# Patient Record
Sex: Female | Born: 1980 | Race: White | Hispanic: No | Marital: Single | Smoking: Current every day smoker
Health system: Southern US, Community
[De-identification: ages and names within clinical notes are randomized; demographics above are authoritative.]

## PROBLEM LIST (undated history)

## (undated) DIAGNOSIS — F1911 Other psychoactive substance abuse, in remission: Secondary | ICD-10-CM

## (undated) DIAGNOSIS — Z98891 History of uterine scar from previous surgery: Secondary | ICD-10-CM

## (undated) DIAGNOSIS — I10 Essential (primary) hypertension: Secondary | ICD-10-CM

## (undated) DIAGNOSIS — F419 Anxiety disorder, unspecified: Secondary | ICD-10-CM

## (undated) DIAGNOSIS — F988 Other specified behavioral and emotional disorders with onset usually occurring in childhood and adolescence: Secondary | ICD-10-CM

## (undated) HISTORY — PX: NO PAST SURGERIES: SHX2092

## (undated) HISTORY — DX: Other psychoactive substance abuse, in remission: F19.11

## (undated) HISTORY — DX: Other specified behavioral and emotional disorders with onset usually occurring in childhood and adolescence: F98.8

---

## 1999-08-04 ENCOUNTER — Emergency Department (HOSPITAL_COMMUNITY): Admission: EM | Admit: 1999-08-04 | Discharge: 1999-08-04 | Payer: Self-pay | Admitting: Emergency Medicine

## 1999-08-17 ENCOUNTER — Emergency Department (HOSPITAL_COMMUNITY): Admission: EM | Admit: 1999-08-17 | Discharge: 1999-08-17 | Payer: Self-pay | Admitting: Emergency Medicine

## 2002-06-22 ENCOUNTER — Emergency Department (HOSPITAL_COMMUNITY): Admission: EM | Admit: 2002-06-22 | Discharge: 2002-06-23 | Payer: Self-pay | Admitting: Emergency Medicine

## 2003-04-20 ENCOUNTER — Emergency Department (HOSPITAL_COMMUNITY): Admission: EM | Admit: 2003-04-20 | Discharge: 2003-04-20 | Payer: Self-pay | Admitting: Emergency Medicine

## 2003-04-20 ENCOUNTER — Encounter: Payer: Self-pay | Admitting: Emergency Medicine

## 2004-02-19 ENCOUNTER — Emergency Department (HOSPITAL_COMMUNITY): Admission: EM | Admit: 2004-02-19 | Discharge: 2004-02-19 | Payer: Self-pay | Admitting: Emergency Medicine

## 2006-09-01 ENCOUNTER — Emergency Department (HOSPITAL_COMMUNITY): Admission: EM | Admit: 2006-09-01 | Discharge: 2006-09-01 | Payer: Self-pay | Admitting: Emergency Medicine

## 2007-08-05 ENCOUNTER — Emergency Department (HOSPITAL_COMMUNITY): Admission: EM | Admit: 2007-08-05 | Discharge: 2007-08-05 | Payer: Self-pay | Admitting: Emergency Medicine

## 2009-03-14 ENCOUNTER — Emergency Department (HOSPITAL_COMMUNITY): Admission: EM | Admit: 2009-03-14 | Discharge: 2009-03-14 | Payer: Self-pay | Admitting: Radiation Oncology

## 2009-04-07 ENCOUNTER — Emergency Department (HOSPITAL_BASED_OUTPATIENT_CLINIC_OR_DEPARTMENT_OTHER): Admission: EM | Admit: 2009-04-07 | Discharge: 2009-04-07 | Payer: Self-pay | Admitting: Emergency Medicine

## 2009-04-07 ENCOUNTER — Ambulatory Visit: Payer: Self-pay | Admitting: Diagnostic Radiology

## 2009-10-29 ENCOUNTER — Emergency Department (HOSPITAL_BASED_OUTPATIENT_CLINIC_OR_DEPARTMENT_OTHER): Admission: EM | Admit: 2009-10-29 | Discharge: 2009-10-29 | Payer: Self-pay | Admitting: Emergency Medicine

## 2011-01-20 ENCOUNTER — Emergency Department (HOSPITAL_COMMUNITY): Payer: Self-pay

## 2011-01-20 ENCOUNTER — Emergency Department (HOSPITAL_COMMUNITY)
Admission: EM | Admit: 2011-01-20 | Discharge: 2011-01-21 | Disposition: A | Discharge status: Home / Self Care | Payer: Self-pay | Attending: Emergency Medicine | Admitting: Emergency Medicine

## 2011-01-20 DIAGNOSIS — I10 Essential (primary) hypertension: Secondary | ICD-10-CM | POA: Insufficient documentation

## 2011-01-20 DIAGNOSIS — R6884 Jaw pain: Secondary | ICD-10-CM | POA: Insufficient documentation

## 2011-02-11 LAB — ABO/RH

## 2011-08-14 LAB — HEPATITIS B SURFACE ANTIGEN: Hepatitis B Surface Ag: NEGATIVE

## 2011-08-14 LAB — ANTIBODY SCREEN: Antibody Screen: NEGATIVE

## 2011-08-14 LAB — ABO/RH: RH Type: POSITIVE

## 2011-08-14 LAB — GC/CHLAMYDIA PROBE AMP, GENITAL: Chlamydia: NEGATIVE

## 2011-08-14 LAB — RUBELLA ANTIBODY, IGM: Rubella: IMMUNE

## 2011-08-28 LAB — RAPID URINE DRUG SCREEN, HOSP PERFORMED
Amphetamines: NOT DETECTED
Barbiturates: NOT DETECTED
Benzodiazepines: POSITIVE — AB
Cocaine: NOT DETECTED
Opiates: POSITIVE — AB

## 2011-08-28 LAB — CBC
HCT: 37.1
Hemoglobin: 13
WBC: 7.6

## 2011-08-28 LAB — DIFFERENTIAL
Basophils Absolute: 0
Basophils Relative: 1
Eosinophils Absolute: 0.2
Monocytes Relative: 5

## 2011-08-28 LAB — BASIC METABOLIC PANEL
GFR calc non Af Amer: >60
Glucose, Bld: 114 — ABNORMAL HIGH
Potassium: 3.2 — ABNORMAL LOW
Sodium: 140

## 2011-12-25 LAB — STREP B DNA PROBE: GBS: NEGATIVE

## 2012-01-21 ENCOUNTER — Other Ambulatory Visit: Payer: Self-pay | Admitting: Obstetrics and Gynecology

## 2012-01-21 ENCOUNTER — Inpatient Hospital Stay (HOSPITAL_COMMUNITY)
Admission: RE | Admit: 2012-01-21 | Discharge: 2012-01-25 | DRG: 766 | Disposition: A | Discharge status: Home / Self Care | Payer: Medicaid Other | Source: Ambulatory Visit | Attending: Obstetrics and Gynecology | Admitting: Obstetrics and Gynecology

## 2012-01-21 ENCOUNTER — Encounter (HOSPITAL_COMMUNITY): Payer: Self-pay | Admitting: *Deleted

## 2012-01-21 DIAGNOSIS — Z98891 History of uterine scar from previous surgery: Secondary | ICD-10-CM

## 2012-01-21 DIAGNOSIS — O1002 Pre-existing essential hypertension complicating childbirth: Principal | ICD-10-CM | POA: Diagnosis present

## 2012-01-21 HISTORY — DX: History of uterine scar from previous surgery: Z98.891

## 2012-01-21 HISTORY — DX: Essential (primary) hypertension: I10

## 2012-01-21 HISTORY — DX: Anxiety disorder, unspecified: F41.9

## 2012-01-21 LAB — CBC
HCT: 35 % — ABNORMAL LOW (ref 36.0–46.0)
Hemoglobin: 11.8 g/dL — ABNORMAL LOW (ref 12.0–15.0)
MCH: 29.6 pg (ref 26.0–34.0)
MCHC: 33.7 g/dL (ref 30.0–36.0)
MCV: 87.7 fL (ref 78.0–100.0)
RDW: 14.9 % (ref 11.5–15.5)

## 2012-01-21 MED ORDER — OXYCODONE-ACETAMINOPHEN 5-325 MG PO TABS: 1.0000 | ORAL_TABLET | ORAL | Status: DC | PRN

## 2012-01-21 MED ORDER — IBUPROFEN 600 MG PO TABS: 600.0000 mg | ORAL_TABLET | Freq: Four times a day (QID) | ORAL | Status: DC | PRN

## 2012-01-21 MED ORDER — ZOLPIDEM TARTRATE 10 MG PO TABS
10.0000 mg | ORAL_TABLET | Freq: Every evening | ORAL | Status: DC | PRN
  Administered 2012-01-22: 10 mg via ORAL
  Filled 2012-01-21: qty 1

## 2012-01-21 MED ORDER — ACETAMINOPHEN 325 MG PO TABS: 650.0000 mg | ORAL_TABLET | ORAL | Status: DC | PRN

## 2012-01-21 MED ORDER — NICOTINE 21 MG/24HR TD PT24
21.0000 mg | MEDICATED_PATCH | Freq: Every day | TRANSDERMAL | Status: DC
  Filled 2012-01-21 (×5): qty 1

## 2012-01-21 MED ORDER — ONDANSETRON HCL 4 MG/2ML IJ SOLN: 4.0000 mg | Freq: Four times a day (QID) | INTRAMUSCULAR | Status: DC | PRN

## 2012-01-21 MED ORDER — TERBUTALINE SULFATE 1 MG/ML IJ SOLN: 0.2500 mg | Freq: Once | INTRAMUSCULAR | Status: AC | PRN

## 2012-01-21 MED ORDER — LIDOCAINE HCL (PF) 1 % IJ SOLN: 30.0000 mL | INTRAMUSCULAR | Status: DC | PRN

## 2012-01-21 MED ORDER — CITRIC ACID-SODIUM CITRATE 334-500 MG/5ML PO SOLN
30.0000 mL | ORAL | Status: DC | PRN
  Administered 2012-01-23: 30 mL via ORAL
  Filled 2012-01-21: qty 15

## 2012-01-21 MED ORDER — LACTATED RINGERS IV SOLN
INTRAVENOUS | Status: DC
  Administered 2012-01-21 – 2012-01-22 (×4): via INTRAVENOUS

## 2012-01-21 MED ORDER — OXYTOCIN 20 UNITS IN LACTATED RINGERS INFUSION - SIMPLE: 125.0000 mL/h | Freq: Once | INTRAVENOUS | Status: DC

## 2012-01-21 MED ORDER — BUTORPHANOL TARTRATE 2 MG/ML IJ SOLN
1.0000 mg | INTRAMUSCULAR | Status: DC | PRN
  Administered 2012-01-22 (×4): 1 mg via INTRAVENOUS
  Filled 2012-01-21 (×4): qty 1

## 2012-01-21 MED ORDER — METHYLDOPA 250 MG PO TABS
250.0000 mg | ORAL_TABLET | Freq: Two times a day (BID) | ORAL | Status: DC
  Administered 2012-01-21 – 2012-01-25 (×5): 250 mg via ORAL
  Filled 2012-01-21 (×10): qty 1

## 2012-01-21 MED ORDER — OXYTOCIN BOLUS FROM INFUSION
500.0000 mL | Freq: Once | INTRAVENOUS | Status: DC
  Filled 2012-01-21: qty 500

## 2012-01-21 MED ORDER — LACTATED RINGERS IV SOLN
500.0000 mL | INTRAVENOUS | Status: DC | PRN
  Administered 2012-01-22 (×2): 1000 mL via INTRAVENOUS

## 2012-01-21 MED ORDER — MISOPROSTOL 25 MCG QUARTER TABLET
25.0000 ug | ORAL_TABLET | ORAL | Status: DC | PRN
  Administered 2012-01-21 – 2012-01-22 (×2): 25 ug via VAGINAL
  Filled 2012-01-21 (×2): qty 0.25

## 2012-01-21 NOTE — H&P (Signed)
[name] is a 31 y.o. female, G1 P0, EGA 39+ weeks with EDC 3-10 presenting for induction due to [facility].  BP controlled with low dose Aldomet throughout pregnancy, reactive NSTs.  Prenatal care otherwise essentially uncomplicated, see prenatal records for complete history.  Maternal Medical History:  Fetal activity: Perceived fetal activity is normal.    Prenatal complications: Hypertension.     OB History    Grav Para Term Preterm Abortions TAB SAB Ect Mult Living   1              Past Medical History  Diagnosis Date  . Hypertension   . Anxiety    Past Surgical History  Procedure Date  . No past surgeries    Family History: family history is not on file. Social History:  reports that she has been smoking Cigarettes.  She has a 16 pack-year smoking history. She has never used smokeless tobacco. She reports that she drinks alcohol. She reports that she uses illicit drugs (Marijuana) about once per week.  Review of Systems  Respiratory: Negative.   Cardiovascular: Negative.     Dilation: Closed Effacement (%): Thick Station: -2;-3 Exam by:: L.Mears,Rn Blood pressure 146/77, pulse 98, temperature 97.4 F (36.3 C), temperature source Oral, resp. rate 18, height 5\' 8"  (1.727 m), weight 102.967 kg (227 lb). Maternal Exam:  Abdomen: Estimated fetal weight is 8 lbs.   Fetal presentation: vertex  Introitus: Normal vulva. Normal vagina.  Pelvis: adequate for delivery.   Cervix: Cervix evaluated by digital exam.     Physical Exam  Constitutional: She appears well-developed and well-nourished.  Cardiovascular: Normal rate, regular rhythm and normal heart sounds.   Respiratory: Effort normal and breath sounds normal. No respiratory distress.  GI:       gravid    Prenatal labs: ABO, Rh: --/Positive/-- (09/27 0000) Antibody: Negative (09/27 0000) Rubella: Immune (09/27 0000) RPR: Nonreactive (09/27 0000)  HBsAg: Negative (09/27 0000)  HIV: Non-reactive (09/27 0000)  GBS:  Negative (02/07 0000)   Assessment/Plan: IUP at 39 weeks with [facility], well controlled with low dose Aldomet, for ripening and induction.  Will ripen with Cytotec, evaluate in am for further options.  Monitor BP closely.     Berneice Zettlemoyer D 01/21/2012, 9:30 PM

## 2012-01-22 ENCOUNTER — Encounter (HOSPITAL_COMMUNITY): Payer: Self-pay | Admitting: *Deleted

## 2012-01-22 ENCOUNTER — Encounter (HOSPITAL_COMMUNITY): Payer: Self-pay | Admitting: Anesthesiology

## 2012-01-22 LAB — CBC
HCT: 36.4 % (ref 36.0–46.0)
Hemoglobin: 11.9 g/dL — ABNORMAL LOW (ref 12.0–15.0)
WBC: 18 10*3/uL — ABNORMAL HIGH (ref 4.0–10.5)

## 2012-01-22 MED ORDER — FENTANYL 2.5 MCG/ML BUPIVACAINE 1/10 % EPIDURAL INFUSION (WH - ANES)
14.0000 mL/h | INTRAMUSCULAR | Status: DC
  Administered 2012-01-22 – 2012-01-23 (×8): 14 mL/h via EPIDURAL
  Filled 2012-01-22 (×8): qty 60

## 2012-01-22 MED ORDER — BUTORPHANOL TARTRATE 2 MG/ML IJ SOLN
2.0000 mg | Freq: Once | INTRAMUSCULAR | Status: AC
  Administered 2012-01-22: 2 mg via INTRAVENOUS
  Filled 2012-01-22: qty 1

## 2012-01-22 MED ORDER — PHENYLEPHRINE 40 MCG/ML (10ML) SYRINGE FOR IV PUSH (FOR BLOOD PRESSURE SUPPORT): 80.0000 ug | PREFILLED_SYRINGE | INTRAVENOUS | Status: DC | PRN

## 2012-01-22 MED ORDER — PHENYLEPHRINE 40 MCG/ML (10ML) SYRINGE FOR IV PUSH (FOR BLOOD PRESSURE SUPPORT)
80.0000 ug | PREFILLED_SYRINGE | INTRAVENOUS | Status: DC | PRN
  Filled 2012-01-22: qty 5

## 2012-01-22 MED ORDER — DIPHENHYDRAMINE HCL 50 MG/ML IJ SOLN: 12.5000 mg | INTRAMUSCULAR | Status: DC | PRN

## 2012-01-22 MED ORDER — EPHEDRINE 5 MG/ML INJ: 10.0000 mg | INTRAVENOUS | Status: DC | PRN

## 2012-01-22 MED ORDER — OXYTOCIN 20 UNITS IN LACTATED RINGERS INFUSION - SIMPLE
1.0000 m[IU]/min | INTRAVENOUS | Status: DC
  Administered 2012-01-22: 1 m[IU]/min via INTRAVENOUS
  Filled 2012-01-22: qty 1000

## 2012-01-22 MED ORDER — LACTATED RINGERS IV SOLN: 500.0000 mL | Freq: Once | INTRAVENOUS | Status: DC

## 2012-01-22 MED ORDER — EPHEDRINE 5 MG/ML INJ
10.0000 mg | INTRAVENOUS | Status: DC | PRN
  Filled 2012-01-22: qty 4

## 2012-01-22 MED ORDER — LIDOCAINE HCL (PF) 1 % IJ SOLN
INTRAMUSCULAR | Status: DC | PRN
  Administered 2012-01-22 (×2): 5 mL

## 2012-01-22 NOTE — Anesthesia Preprocedure Evaluation (Addendum)
Anesthesia Evaluation  Patient identified by MRN, date of birth, ID band Patient awake    Reviewed: Allergy & Precautions, H&P , NPO status , Patient's Chart, lab work & pertinent test results  Airway Mallampati: II TM Distance: >3 FB Neck ROM: full    Dental No notable dental hx.    Pulmonary neg pulmonary ROS,  breath sounds clear to auscultation  Pulmonary exam normal       Cardiovascular hypertension, negative cardio ROS  Rhythm:regular Rate:Normal     Neuro/Psych Anxiety negative neurological ROS  negative psych ROS   GI/Hepatic negative GI ROS, Neg liver ROS,   Endo/Other  negative endocrine ROS  Renal/GU negative Renal ROS     Musculoskeletal   Abdominal Normal abdominal exam  (+)   Peds  Hematology negative hematology ROS (+)   Anesthesia Other Findings Patient states epidural has not worked well on Right side. Left leg is quite numb and difficult to move.   Reproductive/Obstetrics (+) Pregnancy                         Anesthesia Physical Anesthesia Plan  ASA: III and Emergent  Anesthesia Plan: Epidural   Post-op Pain Management:    Induction:   Airway Management Planned:   Additional Equipment:   Intra-op Plan:   Post-operative Plan:   Informed Consent: I have reviewed the patients History and Physical, chart, labs and discussed the procedure including the risks, benefits and alternatives for the proposed anesthesia with the patient or authorized representative who has indicated his/her understanding and acceptance.     Plan Discussed with: Anesthesiologist, CRNA and Surgeon  Anesthesia Plan Comments:        Anesthesia Quick Evaluation

## 2012-01-22 NOTE — Progress Notes (Signed)
Comfortable with epidural Afeb, VSS, BP 140/80 FHT- Cat II, occasional variable, ctx q 2-3 minutes VE- 3-4/70/-2, vtx, IUPC inserted Continue pitocin, monitor progress, in latent labor

## 2012-01-22 NOTE — Anesthesia Procedure Notes (Addendum)
Epidural Patient location during procedure: OB Start time: 01/22/2012 2:30 PM  Staffing Anesthesiologist: Brayton Caves R Performed by: anesthesiologist   Preanesthetic Checklist Completed: patient identified, site marked, surgical consent, pre-op evaluation, timeout performed, IV checked, risks and benefits discussed and monitors and equipment checked  Epidural Patient position: sitting Prep: site prepped and draped and DuraPrep Patient monitoring: continuous pulse ox and blood pressure Approach: midline Injection technique: LOR air and LOR saline  Needle:  Needle type: Tuohy  Needle gauge: 17 G Needle length: 9 cm Needle insertion depth: 6 cm Catheter type: closed end flexible Catheter size: 19 Gauge Catheter at skin depth: 11 cm Test dose: negative  Assessment Events: blood not aspirated, injection not painful, no injection resistance, negative IV test and no paresthesia  Additional Notes Patient identified.  Risk benefits discussed including failed block, incomplete pain control, headache, nerve damage, paralysis, blood pressure changes, nausea, vomiting, reactions to medication both toxic or allergic, and postpartum back pain.  Patient expressed understanding and wished to proceed.  All questions were answered.  Sterile technique used throughout procedure and epidural site dressed with sterile barrier dressing. No paresthesia or other complications noted.The patient did not experience any signs of intravascular injection such as tinnitus or metallic taste in mouth nor signs of intrathecal spread such as rapid motor block. Please see nursing notes for vital signs.   Spinal  Patient location during procedure: OR Start time: 01/23/2012 7:05 AM Staffing Anesthesiologist: Carlen Rebuck A. Performed by: anesthesiologist  Preanesthetic Checklist Completed: patient identified, site marked, surgical consent, pre-op evaluation, timeout performed, IV checked, risks and benefits  discussed and monitors and equipment checked Spinal Block Patient position: sitting Prep: site prepped and draped and DuraPrep Patient monitoring: heart rate, cardiac monitor, continuous pulse ox and blood pressure Approach: midline Location: L4-5 Injection technique: single-shot Needle Needle type: Sprotte  Needle gauge: 24 G Needle length: 9 cm Needle insertion depth: 6 cm Assessment Sensory level: T4 Additional Notes Patient tolerated procedure well. Adequate sensory level.

## 2012-01-22 NOTE — Progress Notes (Signed)
Feeling some ctx, foley bulb came out, had some bleeding Afeb, VSS, BP 130-150/80-90 FHT- Cat I, irreg ctx VE- 3/70/-2, vtx, AROM clear, minimal bleeding Continue pitocin, monitor progress and BP

## 2012-01-22 NOTE — Progress Notes (Signed)
Feeling some ctx, received 2 doses of Cytotec Afeb, VSS, BP 130-140/80 FHT- Cat I, 130-140, irreg ctx VE- 1/40/-3, vtx, 60cc foley bulb inserted with speculum assistance Will try foley bulb, add low dose pitocin, monitor progress, BP ok for now

## 2012-01-23 ENCOUNTER — Encounter (HOSPITAL_COMMUNITY): Payer: Self-pay | Admitting: *Deleted

## 2012-01-23 ENCOUNTER — Encounter (HOSPITAL_COMMUNITY): Payer: Self-pay | Admitting: Anesthesiology

## 2012-01-23 ENCOUNTER — Inpatient Hospital Stay (HOSPITAL_COMMUNITY): Payer: Medicaid Other | Admitting: Anesthesiology

## 2012-01-23 ENCOUNTER — Encounter (HOSPITAL_COMMUNITY)
Admission: RE | Disposition: A | Discharge status: Home / Self Care | Payer: Self-pay | Source: Ambulatory Visit | Attending: Obstetrics and Gynecology

## 2012-01-23 SURGERY — Surgical Case
Anesthesia: Spinal | Site: Abdomen | Wound class: Clean Contaminated

## 2012-01-23 MED ORDER — KETOROLAC TROMETHAMINE 30 MG/ML IJ SOLN: 30.0000 mg | Freq: Four times a day (QID) | INTRAMUSCULAR | Status: AC | PRN

## 2012-01-23 MED ORDER — DIPHENHYDRAMINE HCL 50 MG/ML IJ SOLN: 12.5000 mg | INTRAMUSCULAR | Status: DC | PRN

## 2012-01-23 MED ORDER — DIPHENHYDRAMINE HCL 25 MG PO CAPS: 25.0000 mg | ORAL_CAPSULE | ORAL | Status: DC | PRN

## 2012-01-23 MED ORDER — FENTANYL CITRATE 0.05 MG/ML IJ SOLN
INTRAMUSCULAR | Status: DC | PRN
  Administered 2012-01-23: 25 ug via INTRATHECAL

## 2012-01-23 MED ORDER — FENTANYL CITRATE 0.05 MG/ML IJ SOLN
INTRAMUSCULAR | Status: DC | PRN
  Administered 2012-01-23 (×3): 25 ug via INTRAVENOUS

## 2012-01-23 MED ORDER — METOCLOPRAMIDE HCL 5 MG/ML IJ SOLN: 10.0000 mg | Freq: Three times a day (TID) | INTRAMUSCULAR | Status: DC | PRN

## 2012-01-23 MED ORDER — ONDANSETRON HCL 4 MG/2ML IJ SOLN
INTRAMUSCULAR | Status: DC | PRN
  Administered 2012-01-23: 4 mg via INTRAVENOUS

## 2012-01-23 MED ORDER — PRENATAL MULTIVITAMIN CH
1.0000 | ORAL_TABLET | Freq: Every day | ORAL | Status: DC
  Administered 2012-01-24 – 2012-01-25 (×2): 1 via ORAL
  Filled 2012-01-23 (×2): qty 1

## 2012-01-23 MED ORDER — NALBUPHINE HCL 10 MG/ML IJ SOLN
5.0000 mg | INTRAMUSCULAR | Status: DC | PRN
  Filled 2012-01-23: qty 1

## 2012-01-23 MED ORDER — DIPHENHYDRAMINE HCL 50 MG/ML IJ SOLN: 25.0000 mg | INTRAMUSCULAR | Status: DC | PRN

## 2012-01-23 MED ORDER — MEPERIDINE HCL 25 MG/ML IJ SOLN: 6.2500 mg | INTRAMUSCULAR | Status: DC | PRN

## 2012-01-23 MED ORDER — SIMETHICONE 80 MG PO CHEW: 80.0000 mg | CHEWABLE_TABLET | ORAL | Status: DC | PRN

## 2012-01-23 MED ORDER — SIMETHICONE 80 MG PO CHEW
80.0000 mg | CHEWABLE_TABLET | Freq: Three times a day (TID) | ORAL | Status: DC
  Administered 2012-01-23 – 2012-01-25 (×8): 80 mg via ORAL

## 2012-01-23 MED ORDER — IBUPROFEN 600 MG PO TABS
600.0000 mg | ORAL_TABLET | Freq: Four times a day (QID) | ORAL | Status: DC
  Administered 2012-01-23 – 2012-01-25 (×8): 600 mg via ORAL
  Filled 2012-01-23 (×8): qty 1

## 2012-01-23 MED ORDER — FENTANYL CITRATE 0.05 MG/ML IJ SOLN: 25.0000 ug | INTRAMUSCULAR | Status: DC | PRN

## 2012-01-23 MED ORDER — FENTANYL CITRATE 0.05 MG/ML IJ SOLN
INTRAMUSCULAR | Status: AC
  Filled 2012-01-23: qty 2

## 2012-01-23 MED ORDER — SODIUM CHLORIDE 0.9 % IV SOLN
1.0000 ug/kg/h | INTRAVENOUS | Status: DC | PRN
  Filled 2012-01-23: qty 2.5

## 2012-01-23 MED ORDER — PHENYLEPHRINE HCL 10 MG/ML IJ SOLN
INTRAMUSCULAR | Status: DC | PRN
  Administered 2012-01-23 (×2): 80 ug via INTRAVENOUS

## 2012-01-23 MED ORDER — OXYTOCIN 10 UNIT/ML IJ SOLN
INTRAMUSCULAR | Status: AC
  Filled 2012-01-23: qty 4

## 2012-01-23 MED ORDER — TETANUS-DIPHTH-ACELL PERTUSSIS 5-2.5-18.5 LF-MCG/0.5 IM SUSP: 0.5000 mL | Freq: Once | INTRAMUSCULAR | Status: DC

## 2012-01-23 MED ORDER — IBUPROFEN 600 MG PO TABS: 600.0000 mg | ORAL_TABLET | Freq: Four times a day (QID) | ORAL | Status: DC | PRN

## 2012-01-23 MED ORDER — LIDOCAINE-EPINEPHRINE (PF) 2 %-1:200000 IJ SOLN
INTRAMUSCULAR | Status: AC
  Filled 2012-01-23: qty 20

## 2012-01-23 MED ORDER — ONDANSETRON HCL 4 MG/2ML IJ SOLN: 4.0000 mg | INTRAMUSCULAR | Status: DC | PRN

## 2012-01-23 MED ORDER — CEFAZOLIN SODIUM 1-5 GM-% IV SOLN
INTRAVENOUS | Status: DC | PRN
  Administered 2012-01-23: 2 g via INTRAVENOUS

## 2012-01-23 MED ORDER — SODIUM BICARBONATE 8.4 % IV SOLN
INTRAVENOUS | Status: AC
  Filled 2012-01-23: qty 50

## 2012-01-23 MED ORDER — ONDANSETRON HCL 4 MG/2ML IJ SOLN
INTRAMUSCULAR | Status: AC
  Filled 2012-01-23: qty 2

## 2012-01-23 MED ORDER — SENNOSIDES-DOCUSATE SODIUM 8.6-50 MG PO TABS
2.0000 | ORAL_TABLET | Freq: Every day | ORAL | Status: DC
  Administered 2012-01-23 – 2012-01-24 (×2): 2 via ORAL

## 2012-01-23 MED ORDER — OXYTOCIN 10 UNIT/ML IJ SOLN
INTRAMUSCULAR | Status: DC | PRN
  Administered 2012-01-23: 20 [IU]

## 2012-01-23 MED ORDER — ACETAMINOPHEN 10 MG/ML IV SOLN
1000.0000 mg | Freq: Four times a day (QID) | INTRAVENOUS | Status: AC | PRN
  Filled 2012-01-23: qty 100

## 2012-01-23 MED ORDER — CEFAZOLIN SODIUM-DEXTROSE 2-3 GM-% IV SOLR
2.0000 g | INTRAVENOUS | Status: DC
  Filled 2012-01-23: qty 50

## 2012-01-23 MED ORDER — LANOLIN HYDROUS EX OINT: 1.0000 "application " | TOPICAL_OINTMENT | CUTANEOUS | Status: DC | PRN

## 2012-01-23 MED ORDER — WITCH HAZEL-GLYCERIN EX PADS: 1.0000 "application " | MEDICATED_PAD | CUTANEOUS | Status: DC | PRN

## 2012-01-23 MED ORDER — KETOROLAC TROMETHAMINE 30 MG/ML IJ SOLN
INTRAMUSCULAR | Status: AC
  Administered 2012-01-23: 30 mg via INTRAVENOUS
  Filled 2012-01-23: qty 1

## 2012-01-23 MED ORDER — ONDANSETRON HCL 4 MG PO TABS: 4.0000 mg | ORAL_TABLET | ORAL | Status: DC | PRN

## 2012-01-23 MED ORDER — KETOROLAC TROMETHAMINE 30 MG/ML IJ SOLN
30.0000 mg | Freq: Four times a day (QID) | INTRAMUSCULAR | Status: AC | PRN
  Administered 2012-01-23: 30 mg via INTRAVENOUS

## 2012-01-23 MED ORDER — SODIUM CHLORIDE 0.9 % IJ SOLN: 3.0000 mL | INTRAMUSCULAR | Status: DC | PRN

## 2012-01-23 MED ORDER — ACETAMINOPHEN 325 MG PO TABS: 325.0000 mg | ORAL_TABLET | ORAL | Status: DC | PRN

## 2012-01-23 MED ORDER — OXYTOCIN 20 UNITS IN LACTATED RINGERS INFUSION - SIMPLE
125.0000 mL/h | INTRAVENOUS | Status: AC
  Administered 2012-01-23: 125 mL/h via INTRAVENOUS
  Filled 2012-01-23: qty 1000

## 2012-01-23 MED ORDER — MAGNESIUM HYDROXIDE 400 MG/5ML PO SUSP: 30.0000 mL | ORAL | Status: DC | PRN

## 2012-01-23 MED ORDER — BUPIVACAINE IN DEXTROSE 0.75-8.25 % IT SOLN
INTRATHECAL | Status: DC | PRN
  Administered 2012-01-23: 1.6 mL via INTRATHECAL

## 2012-01-23 MED ORDER — NALOXONE HCL 0.4 MG/ML IJ SOLN: 0.4000 mg | INTRAMUSCULAR | Status: DC | PRN

## 2012-01-23 MED ORDER — MENTHOL 3 MG MT LOZG: 1.0000 | LOZENGE | OROMUCOSAL | Status: DC | PRN

## 2012-01-23 MED ORDER — MORPHINE SULFATE (PF) 0.5 MG/ML IJ SOLN
INTRAMUSCULAR | Status: DC | PRN
  Administered 2012-01-23: .15 mg via EPIDURAL

## 2012-01-23 MED ORDER — OXYCODONE-ACETAMINOPHEN 5-325 MG PO TABS
1.0000 | ORAL_TABLET | ORAL | Status: DC | PRN
  Administered 2012-01-23: 1 via ORAL
  Administered 2012-01-23: 2 via ORAL
  Administered 2012-01-23: 1 via ORAL
  Administered 2012-01-24 (×6): 2 via ORAL
  Filled 2012-01-23: qty 1
  Filled 2012-01-23 (×4): qty 2
  Filled 2012-01-23 (×2): qty 1
  Filled 2012-01-23 (×4): qty 2
  Filled 2012-01-23: qty 1

## 2012-01-23 MED ORDER — DIPHENHYDRAMINE HCL 25 MG PO CAPS: 25.0000 mg | ORAL_CAPSULE | Freq: Four times a day (QID) | ORAL | Status: DC | PRN

## 2012-01-23 MED ORDER — MORPHINE SULFATE 0.5 MG/ML IJ SOLN
INTRAMUSCULAR | Status: AC
  Filled 2012-01-23: qty 10

## 2012-01-23 MED ORDER — SCOPOLAMINE 1 MG/3DAYS TD PT72
MEDICATED_PATCH | TRANSDERMAL | Status: AC
  Administered 2012-01-23: 1.5 mg via TRANSDERMAL
  Filled 2012-01-23: qty 1

## 2012-01-23 MED ORDER — SCOPOLAMINE 1 MG/3DAYS TD PT72
1.0000 | MEDICATED_PATCH | Freq: Once | TRANSDERMAL | Status: DC
  Administered 2012-01-23: 1.5 mg via TRANSDERMAL

## 2012-01-23 MED ORDER — SODIUM BICARBONATE 8.4 % IV SOLN
INTRAVENOUS | Status: DC | PRN
  Administered 2012-01-23: 5 mL via EPIDURAL

## 2012-01-23 MED ORDER — ONDANSETRON HCL 4 MG/2ML IJ SOLN: 4.0000 mg | Freq: Three times a day (TID) | INTRAMUSCULAR | Status: DC | PRN

## 2012-01-23 MED ORDER — MEASLES, MUMPS & RUBELLA VAC ~~LOC~~ INJ
0.5000 mL | INJECTION | Freq: Once | SUBCUTANEOUS | Status: DC
  Filled 2012-01-23: qty 0.5

## 2012-01-23 MED ORDER — DIBUCAINE 1 % RE OINT: 1.0000 "application " | TOPICAL_OINTMENT | RECTAL | Status: DC | PRN

## 2012-01-23 MED ORDER — ZOLPIDEM TARTRATE 5 MG PO TABS: 5.0000 mg | ORAL_TABLET | Freq: Every evening | ORAL | Status: DC | PRN

## 2012-01-23 MED ORDER — PHENYLEPHRINE 40 MCG/ML (10ML) SYRINGE FOR IV PUSH (FOR BLOOD PRESSURE SUPPORT)
PREFILLED_SYRINGE | INTRAVENOUS | Status: AC
  Filled 2012-01-23: qty 5

## 2012-01-23 SURGICAL SUPPLY — 36 items
APL SKNCLS STERI-STRIP NONHPOA (GAUZE/BANDAGES/DRESSINGS) ×1
BENZOIN TINCTURE PRP APPL 2/3 (GAUZE/BANDAGES/DRESSINGS) ×2 IMPLANT
CHLORAPREP W/TINT 26ML (MISCELLANEOUS) ×2 IMPLANT
CLOTH BEACON ORANGE TIMEOUT ST (SAFETY) ×2 IMPLANT
CONTAINER PREFILL 10% NBF 15ML (MISCELLANEOUS) IMPLANT
DRESSING TELFA 8X3 (GAUZE/BANDAGES/DRESSINGS) ×1 IMPLANT
DRSG COVADERM 4X10 (GAUZE/BANDAGES/DRESSINGS) ×1 IMPLANT
DRSG PAD ABDOMINAL 8X10 ST (GAUZE/BANDAGES/DRESSINGS) ×2 IMPLANT
ELECT REM PT RETURN 9FT ADLT (ELECTROSURGICAL) ×2
ELECTRODE REM PT RTRN 9FT ADLT (ELECTROSURGICAL) ×1 IMPLANT
EXTRACTOR VACUUM KIWI (MISCELLANEOUS) IMPLANT
EXTRACTOR VACUUM M CUP 4 TUBE (SUCTIONS) IMPLANT
GLOVE BIO SURGEON STRL SZ8 (GLOVE) ×2 IMPLANT
GLOVE ORTHO TXT STRL SZ7.5 (GLOVE) ×2 IMPLANT
GLOVE SURG SS PI 7.5 STRL IVOR (GLOVE) ×4 IMPLANT
GOWN PREVENTION PLUS LG XLONG (DISPOSABLE) ×4 IMPLANT
KIT ABG SYR 3ML LUER SLIP (SYRINGE) IMPLANT
NDL HYPO 25X5/8 SAFETYGLIDE (NEEDLE) ×1 IMPLANT
NEEDLE HYPO 25X5/8 SAFETYGLIDE (NEEDLE) ×2 IMPLANT
NS IRRIG 1000ML POUR BTL (IV SOLUTION) ×2 IMPLANT
PACK C SECTION WH (CUSTOM PROCEDURE TRAY) ×2 IMPLANT
RTRCTR C-SECT PINK 25CM LRG (MISCELLANEOUS) ×2 IMPLANT
SLEEVE SCD COMPRESS KNEE MED (MISCELLANEOUS) ×2 IMPLANT
SPONGE GAUZE 4X4 12PLY (GAUZE/BANDAGES/DRESSINGS) ×1 IMPLANT
STAPLER VISISTAT 35W (STAPLE) ×1 IMPLANT
STRIP CLOSURE SKIN 1/2X4 (GAUZE/BANDAGES/DRESSINGS) ×1 IMPLANT
SUT CHROMIC 1 CTX 36 (SUTURE) ×4 IMPLANT
SUT PLAIN 0 NONE (SUTURE) IMPLANT
SUT PLAIN 2 0 XLH (SUTURE) ×1 IMPLANT
SUT VIC AB 0 CT1 27 (SUTURE) ×4
SUT VIC AB 0 CT1 27XBRD ANBCTR (SUTURE) ×2 IMPLANT
SUT VIC AB 4-0 KS 27 (SUTURE) ×1 IMPLANT
TAPE CLOTH SURG 4X10 WHT LF (GAUZE/BANDAGES/DRESSINGS) ×1 IMPLANT
TOWEL OR 17X24 6PK STRL BLUE (TOWEL DISPOSABLE) ×4 IMPLANT
TRAY FOLEY CATH 14FR (SET/KITS/TRAYS/PACK) ×1 IMPLANT
WATER STERILE IRR 1000ML POUR (IV SOLUTION) IMPLANT

## 2012-01-23 NOTE — Progress Notes (Signed)
Ok with epidural, but feeling some ctx Afeb, VSS, BP 130-150/80-100 FHT- Cat II, occ variable, ctx q 2-3 min VE- 4/80/-2, vtx Minimal if any change over the past 16 hours since foley bulb came out and AROM despite adequate ctx.  Could still be latent labor, but vertex is not coming into pelvis very well.  Discussed with patient, will proceed with c-section for failed induction.  Procedure and risks discussed.

## 2012-01-23 NOTE — Anesthesia Postprocedure Evaluation (Signed)
  Anesthesia Post Note  Patient: [name]  Procedure(s) Performed: Procedure(s) (LRB): CESAREAN SECTION (N/A)  Anesthesia type: Spinal  Patient location: Mother/Baby  Post pain: Pain level controlled  Post assessment: Post-op Vital signs reviewed  Last Vitals:  Filed Vitals:   01/23/12 1505  BP: 120/69  Pulse:   Temp:   Resp:     Post vital signs: Reviewed  Level of consciousness: awake  Complications: No apparent anesthesia complications

## 2012-01-23 NOTE — Op Note (Signed)
Preoperative diagnosis: Intrauterine pregnancy at 39 weeks, CHTN, failed induction Postoperative diagnosis: Same Procedure: Primary low transverse cesarean section without extensions Surgeon: Lavina Hamman M.D. Anesthesia: Spinal Findings: Patient had normal gravid anatomy and delivered a viable female infant with Apgars of 9 and 9, weight pending Estimated blood loss: 800 cc Specimens: Placenta sent to labor and delivery Complications: None  Procedure in detail: The patient was taken to the operating room and placed in the dorsosupine position. Her previously placed epidural did not work adequately, so she had spinal anesthesia instilled by Dr. Malen Gauze. Abdomen was then prepped and draped in the usual sterile fashion. A Foley catheter had previously been placed. The level of her anesthesia was found to be adequate. Abdomen was entered via a standard Pfannenstiel incision. Once the peritoneal cavity was entered the Alexis disposable self-retaining retractor was placed and good visualization was achieved. A 4 cm transverse incision was then made in the lower uterine segment pushing the bladder inferior. Once the uterine cavity was entered the incision was extended digitally. The fetal vertex was grasped and delivered through the incision atraumatically. Mouth and nares were suctioned. The remainder of the infant then delivered atraumatically. Cord was doubly clamped and cut and the infant handed to the awaiting pediatric team. Cord blood was obtained. The placenta delivered spontaneously. Uterus was wiped dry with clean lap pad and all clots and debris were removed. Uterine incision was inspected and found to be free of extensions. Uterine incision was closed in 2 layers with the first layer being a running locking layer with #1 chromic and the second layer being an imbricating layer also with #1 chromic. Bleeding from the right angle was controlled with #1 chromic.  Tubes and ovaries were inspected and  found to be normal. Uterine incision was inspected and found to be hemostatic. Bleeding from serosal edges was controlled with electrocautery. The Alexis retractor was removed. Subfascial space was irrigated and made hemostatic with electrocautery. Rectus muscles were reapproximated with 2 sutures of #1 chromic due to prolapsing omentum.  Fascia was closed in running fashion starting at both ends and meeting in the middle with 0 Vicryl. Subcutaneous tissue was then irrigated and made hemostatic with electrocautery, and closed with running 2-0 plain gut. Skin was closed with a running subcuticular suture of 4-0 vicryl, followed by steri-strips and a sterile dressing. Patient tolerated the procedure well and was taken to the recovery in stable condition. Counts were correct x2, she received Ancef 2 g IV at the beginning of the procedure and she had PAS hose on throughout the procedure.

## 2012-01-23 NOTE — Anesthesia Postprocedure Evaluation (Signed)
Anesthesia Post Note  Patient: [name]  Procedure(s) Performed: Procedure(s) (LRB): CESAREAN SECTION (N/A)  Anesthesia type: Spinal  Patient location: PACU  Post pain: Pain level controlled  Post assessment: Post-op Vital signs reviewed  Last Vitals:  Filed Vitals:   01/23/12 0845  BP: 130/70  Pulse: 96  Temp:   Resp: 16    Post vital signs: Reviewed  Level of consciousness: awake  Complications: No apparent anesthesia complications

## 2012-01-23 NOTE — Transfer of Care (Signed)
Immediate Anesthesia Transfer of Care Note  Patient: [name]  Procedure(s) Performed: Procedure(s) (LRB): CESAREAN SECTION (N/A)  Patient Location: PACU  Anesthesia Type: Spinal  Level of Consciousness: awake  Airway & Oxygen Therapy: Patient Spontanous Breathing  Post-op Assessment: Report given to PACU RN  Post vital signs: Reviewed and stable  Complications: No apparent anesthesia complications

## 2012-01-23 NOTE — Addendum Note (Signed)
Addendum  created 01/23/12 0905 by Velna Hatchet, MD   Modules edited:Orders, PRL Based Order Sets

## 2012-01-23 NOTE — Progress Notes (Signed)
UR chart review completed.  

## 2012-01-23 NOTE — Addendum Note (Signed)
Addendum  created 01/23/12 1602 by Algis Greenhouse, CRNA   Modules edited:Notes Section

## 2012-01-23 NOTE — Consult Note (Signed)
Neonatology Note:  Attendance at C-section:  I was asked to attend this primary C/S at term due to failed induction. The mother is a G1P0 O pos, GBS neg with chronic hypertension, on Aldomet. ROM 18 hours prior to delivery, fluid clear. Infant vigorous with good spontaneous cry and tone. Needed only minimal bulb suctioning. Ap 9/9. Lungs clear to ausc in DR. To CN to care of Pediatrician.  Neli Fofana, MD  

## 2012-01-24 LAB — CBC
MCH: 29.7 pg (ref 26.0–34.0)
MCV: 90.1 fL (ref 78.0–100.0)
Platelets: 234 10*3/uL (ref 150–400)
RDW: 15 % (ref 11.5–15.5)

## 2012-01-24 NOTE — Progress Notes (Signed)
Subjective: Postpartum Day 1: Cesarean Delivery Patient reports incisional pain, tolerating PO and no problems voiding.  Nl lochia, pain controlled  Objective: Vital signs in last 24 hours: Temp:  [97.5 F (36.4 C)-100.1 F (37.8 C)] 97.5 F (36.4 C) (03/09 0652) Pulse Rate:  [89-102] 95  (03/09 0652) Resp:  [16-24] 16  (03/09 0652) BP: (109-132)/(53-84) 128/83 mmHg (03/09 0652) SpO2:  [95 %-98 %] 98 % (03/09 4098)  Physical Exam:  General: alert and no distress Lochia: appropriate Uterine Fundus: firm Incision: healing well DVT Evaluation: No evidence of DVT seen on physical exam.   Basename 01/24/12 0500 01/22/12 1348  HGB 10.2* 11.9*  HCT 31.0* 36.4    Assessment/Plan: Status post Cesarean section. Doing well postoperatively.  Continue current care.    BOVARD,Andry Bogden 01/24/2012, 9:07 AM

## 2012-01-25 ENCOUNTER — Encounter (HOSPITAL_COMMUNITY): Payer: Self-pay | Admitting: Obstetrics and Gynecology

## 2012-01-25 DIAGNOSIS — Z98891 History of uterine scar from previous surgery: Secondary | ICD-10-CM

## 2012-01-25 HISTORY — DX: History of uterine scar from previous surgery: Z98.891

## 2012-01-25 MED ORDER — OXYCODONE-ACETAMINOPHEN 5-325 MG PO TABS
1.0000 | ORAL_TABLET | ORAL | Status: DC | PRN
  Administered 2012-01-25 (×2): 2 via ORAL
  Filled 2012-01-25: qty 2

## 2012-01-25 MED ORDER — OXYCODONE-ACETAMINOPHEN 5-325 MG PO TABS: 1.0000 | ORAL_TABLET | Freq: Four times a day (QID) | ORAL | Status: AC | PRN

## 2012-01-25 MED ORDER — IBUPROFEN 800 MG PO TABS
800.0000 mg | ORAL_TABLET | Freq: Three times a day (TID) | ORAL | Status: AC | PRN
Start: 2012-01-25 — End: 2012-02-04

## 2012-01-25 MED ORDER — PRENATAL MULTIVITAMIN CH: 1.0000 | ORAL_TABLET | Freq: Every day | ORAL | Status: DC

## 2012-01-25 NOTE — Discharge Summary (Signed)
Obstetric Discharge Summary Reason for Admission: induction of labor Prenatal Procedures: none Intrapartum Procedures: cesarean: low cervical, transverse Postpartum Procedures: none Complications-Operative and Postpartum: none Hemoglobin  Date Value Range Status  01/24/2012 10.2* 12.0-15.0 (g/dL) Final     HCT  Date Value Range Status  01/24/2012 31.0* 36.0-46.0 (%) Final    Discharge Diagnoses: Term Pregnancy-delivered  Discharge Information: Date: 01/25/2012 Activity: pelvic rest Diet: routine Medications: PNV, Ibuprofen and Percocet Condition: stable Instructions: refer to practice specific booklet Discharge to: home Follow-up Information    Follow up with MEISINGER,TODD D, MD. Schedule an appointment as soon as possible for a visit in 2 weeks.   Contact information:   [address] [place] [address] [phone]          Newborn Data: Live born female  Birth Weight: 7 lb 3.9 oz (3285 g) APGAR: 9, 9  Home with mother.  BOVARD,Man Effertz 01/25/2012, 11:29 AM

## 2012-01-25 NOTE — Progress Notes (Signed)
Subjective: Postpartum Day 2: Cesarean Delivery Patient reports incisional pain, tolerating PO and no problems voiding.  Pain controlled, desires discharge home  Objective: Vital signs in last 24 hours: Temp:  [97.8 F (36.6 C)-98.1 F (36.7 C)] 98 F (36.7 C) (03/10 0556) Pulse Rate:  [80-90] 87  (03/10 1035) Resp:  [18-20] 20  (03/10 0556) BP: (130-159)/(70-87) 137/87 mmHg (03/10 1035) SpO2:  [98 %] 98 % (03/09 2145)  Physical Exam:  General: alert and no distress Lochia: appropriate Uterine Fundus: firm Incision: healing well DVT Evaluation: No evidence of DVT seen on physical exam.   Basename 01/24/12 0500 01/22/12 1348  HGB 10.2* 11.9*  HCT 31.0* 36.4    Assessment/Plan: Status post Cesarean section. Doing well postoperatively.  Discharge home with standard precautions and return to clinic in 2 weeks.  D/C with Motrin/ Percocet/ PNV.  BOVARD,Dashay Giesler 01/25/2012, 11:23 AM

## 2012-01-26 ENCOUNTER — Encounter (HOSPITAL_COMMUNITY): Payer: Self-pay | Admitting: Obstetrics and Gynecology

## 2012-06-14 ENCOUNTER — Emergency Department (HOSPITAL_COMMUNITY): Payer: Medicaid Other

## 2012-06-14 ENCOUNTER — Emergency Department (HOSPITAL_COMMUNITY)
Admission: EM | Admit: 2012-06-14 | Discharge: 2012-06-14 | Disposition: A | Discharge status: Home / Self Care | Payer: Medicaid Other | Attending: Emergency Medicine | Admitting: Emergency Medicine

## 2012-06-14 ENCOUNTER — Encounter (HOSPITAL_COMMUNITY): Payer: Self-pay | Admitting: Emergency Medicine

## 2012-06-14 DIAGNOSIS — T7492XA Unspecified child maltreatment, confirmed, initial encounter: Secondary | ICD-10-CM | POA: Insufficient documentation

## 2012-06-14 DIAGNOSIS — S0003XA Contusion of scalp, initial encounter: Secondary | ICD-10-CM | POA: Insufficient documentation

## 2012-06-14 DIAGNOSIS — S0083XA Contusion of other part of head, initial encounter: Secondary | ICD-10-CM

## 2012-06-14 DIAGNOSIS — T7491XA Unspecified adult maltreatment, confirmed, initial encounter: Secondary | ICD-10-CM | POA: Insufficient documentation

## 2012-06-14 DIAGNOSIS — I10 Essential (primary) hypertension: Secondary | ICD-10-CM | POA: Insufficient documentation

## 2012-06-14 DIAGNOSIS — S01409A Unspecified open wound of unspecified cheek and temporomandibular area, initial encounter: Secondary | ICD-10-CM | POA: Insufficient documentation

## 2012-06-14 DIAGNOSIS — F411 Generalized anxiety disorder: Secondary | ICD-10-CM | POA: Insufficient documentation

## 2012-06-14 DIAGNOSIS — IMO0002 Reserved for concepts with insufficient information to code with codable children: Secondary | ICD-10-CM | POA: Insufficient documentation

## 2012-06-14 DIAGNOSIS — F17200 Nicotine dependence, unspecified, uncomplicated: Secondary | ICD-10-CM | POA: Insufficient documentation

## 2012-06-14 MED ORDER — HYDROCODONE-ACETAMINOPHEN 5-325 MG PO TABS
1.0000 | ORAL_TABLET | Freq: Once | ORAL | Status: AC
  Administered 2012-06-14: 1 via ORAL

## 2012-06-14 MED ORDER — TETANUS-DIPHTH-ACELL PERTUSSIS 5-2.5-18.5 LF-MCG/0.5 IM SUSP
0.5000 mL | Freq: Once | INTRAMUSCULAR | Status: DC
  Filled 2012-06-14: qty 0.5

## 2012-06-14 MED ORDER — HYDROCODONE-ACETAMINOPHEN 5-325 MG PO TABS
1.0000 | ORAL_TABLET | Freq: Once | ORAL | Status: DC
  Filled 2012-06-14: qty 1

## 2012-06-14 MED ORDER — HYDROCODONE-ACETAMINOPHEN 5-325 MG PO TABS: 1.0000 | ORAL_TABLET | Freq: Four times a day (QID) | ORAL | Status: AC | PRN
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
## 2012-11-25 ENCOUNTER — Other Ambulatory Visit (HOSPITAL_COMMUNITY)
Admission: RE | Admit: 2012-11-25 | Discharge: 2012-11-25 | Disposition: A | Discharge status: Home / Self Care | Payer: Medicaid Other | Source: Ambulatory Visit | Attending: Emergency Medicine | Admitting: Emergency Medicine

## 2012-11-25 ENCOUNTER — Emergency Department (HOSPITAL_COMMUNITY)
Admission: EM | Admit: 2012-11-25 | Discharge: 2012-11-25 | Disposition: A | Discharge status: Home / Self Care | Payer: Medicaid Other | Source: Home / Self Care | Attending: Emergency Medicine | Admitting: Emergency Medicine

## 2012-11-25 ENCOUNTER — Encounter (HOSPITAL_COMMUNITY): Payer: Self-pay | Admitting: Emergency Medicine

## 2012-11-25 DIAGNOSIS — N760 Acute vaginitis: Secondary | ICD-10-CM | POA: Insufficient documentation

## 2012-11-25 DIAGNOSIS — Z113 Encounter for screening for infections with a predominantly sexual mode of transmission: Secondary | ICD-10-CM | POA: Insufficient documentation

## 2012-11-25 DIAGNOSIS — Z202 Contact with and (suspected) exposure to infections with a predominantly sexual mode of transmission: Secondary | ICD-10-CM

## 2012-11-25 DIAGNOSIS — K047 Periapical abscess without sinus: Secondary | ICD-10-CM

## 2012-11-25 MED ORDER — TRAMADOL HCL 50 MG PO TABS: 50.0000 mg | ORAL_TABLET | Freq: Four times a day (QID) | ORAL | Status: DC | PRN

## 2012-11-25 MED ORDER — IBUPROFEN 800 MG PO TABS: 800.0000 mg | ORAL_TABLET | Freq: Three times a day (TID) | ORAL | Status: DC

## 2012-11-25 MED ORDER — METRONIDAZOLE 500 MG PO TABS: 500.0000 mg | ORAL_TABLET | Freq: Two times a day (BID) | ORAL | Status: DC

## 2012-11-25 NOTE — ED Notes (Signed)
Notified C. Chatten, NP of abn vitals
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
## 2012-11-25 NOTE — ED Provider Notes (Signed)
History     CSN: [id number]  Arrival date & time 11/25/12  1709   None     Chief Complaint  Patient presents with  . Dental Pain  . Exposure to STD    (Consider location/radiation/quality/duration/timing/severity/associated sxs/prior treatment) Patient is a 32 y.o. female presenting with tooth pain and STD exposure. The history is provided by the patient.  Dental PainThe primary symptoms include mouth pain. The symptoms began 3 to 5 days ago. The symptoms are unchanged. The symptoms are new. The symptoms occur frequently.  Affected locations include: teeth and gum(s). At its highest the mouth pain was at 8/10.  Additional symptoms include: dental sensitivity to temperature and gum tenderness. Additional symptoms do not include: trismus, jaw pain, facial swelling, trouble swallowing, taste disturbance, smell disturbance, ear pain and fatigue. Medical issues include: smoking.   Exposure to STD This is a new problem. Episode frequency: pt reports her boyfriend called and told her he tested positive for trich, pt denies known symptoms. Pertinent negatives include no abdominal pain.    Past Medical History  Diagnosis Date  . Hypertension   . Anxiety   . S/P cesarean section 01/25/2012    Past Surgical History  Procedure Date  . No past surgeries   . Cesarean section 01/23/2012    Procedure: CESAREAN SECTION;  Surgeon: Lavina Hamman, MD;  Location: [facility];  Service: Gynecology;  Laterality: N/A;    No family history on file.  History  Substance Use Topics  . Smoking status: Current Every Day Smoker -- 1.0 packs/day for 16 years    Types: Cigarettes  . Smokeless tobacco: Never Used  . Alcohol Use: Yes     Comment: occassional wine during pregnancy    OB History    Grav Para Term Preterm Abortions TAB SAB Ect Mult Living   1 1 1  0 0 0 0 0 0 1      Review of Systems  Constitutional: Negative for fatigue.  HENT: Positive for dental problem. Negative for ear pain, facial  swelling and trouble swallowing.   Gastrointestinal: Negative for nausea, vomiting and abdominal pain.  Genitourinary: Negative for vaginal bleeding and vaginal discharge.  All other systems reviewed and are negative.    Allergies  Review of patient's allergies indicates no known allergies.  Home Medications   Current Outpatient Rx  Name  Route  Sig  Dispense  Refill  . AMPHETAMINE-DEXTROAMPHETAMINE 15 MG PO TABS   Oral   Take 15 mg by mouth daily.         Marland Kitchen BISOPROLOL FUMARATE 10 MG PO TABS   Oral   Take 10 mg by mouth daily.         Marland Kitchen CALCIUM CARBONATE ANTACID 500 MG PO CHEW   Oral   Chew 1 tablet by mouth daily as needed. For heartburn         . IBUPROFEN 800 MG PO TABS   Oral   Take 1 tablet (800 mg total) by mouth 3 (three) times daily.   21 tablet   0   . METRONIDAZOLE 500 MG PO TABS   Oral   Take 1 tablet (500 mg total) by mouth 2 (two) times daily.   14 tablet   0   . TRAMADOL HCL 50 MG PO TABS   Oral   Take 1 tablet (50 mg total) by mouth every 6 (six) hours as needed for pain.   15 tablet   0     BP 137/81  Pulse 129  Temp 98.2 F (36.8 C) (Oral)  Resp 18  SpO2 100%  LMP 10/22/2012  Breastfeeding? No  Physical Exam  Nursing note and vitals reviewed. Constitutional: She is oriented to person, place, and time. Vital signs are normal. She appears well-developed and well-nourished. She is active and cooperative.  HENT:  Head: Normocephalic. No trismus in the jaw.  Mouth/Throat: Uvula is midline, oropharynx is clear and moist and mucous membranes are normal. Abnormal dentition. Dental abscesses and dental caries present.    Eyes: Conjunctivae normal are normal. Pupils are equal, round, and reactive to light. No scleral icterus.  Neck: Trachea normal. Neck supple.  Cardiovascular: Normal rate, regular rhythm, normal heart sounds, intact distal pulses and normal pulses.   Pulmonary/Chest: Effort normal and breath sounds normal.    Lymphadenopathy:    She has no cervical adenopathy.    She has no axillary adenopathy.       Right: No inguinal adenopathy present.       Left: No inguinal adenopathy present.  Neurological: She is alert and oriented to person, place, and time. No cranial nerve deficit or sensory deficit.  Skin: Skin is warm and dry.  Psychiatric: She has a normal mood and affect. Her speech is normal and behavior is normal. Judgment and thought content normal. Cognition and memory are normal.    ED Course  Procedures (including critical care time)   Labs Reviewed  URINE CYTOLOGY ANCILLARY ONLY   No results found.   1. Exposure to STD   2. Dental abscess       MDM  Medications as prescribed, follow up with dentist for dental evaluation Condoms for STD prevention, all partners will need treatment.  Reports nervous upon arrival, pulse recheck 109 prior to discharge.        Johnsie Kindred, NP 11/25/12 2205

## 2012-11-26 NOTE — ED Provider Notes (Signed)
Medical screening examination/treatment/procedure(s) were performed by non-physician practitioner and as supervising physician I was immediately available for consultation/collaboration.  Jakson Delpilar   Manjot Beumer, MD 11/26/12 0955 

## 2012-12-01 NOTE — ED Notes (Signed)
Call from patient to discuss test results. After verifying ID, discussed positive trichomonas findings ; has Rx for flagyl , and will take as directed

## 2012-12-17 ENCOUNTER — Telehealth (HOSPITAL_COMMUNITY): Payer: Self-pay | Admitting: *Deleted

## 2012-12-17 NOTE — ED Notes (Signed)
GC/Chlamydia neg., Trich pos.  Pt. adequately treated with Flagyl.  I called pt. Pt. verified x 2 and given results.  Pt. told she was adequately treated,  You need to notify your partner to be treated with Flagyl, no sex until  your partner has been treated and to practice safe sex. She said he was already treated and was the one that notified her. Vassie Moselle 12/17/2012

## 2013-01-16 ENCOUNTER — Encounter (HOSPITAL_COMMUNITY): Payer: Self-pay | Admitting: Anesthesiology

## 2013-01-16 ENCOUNTER — Emergency Department (HOSPITAL_COMMUNITY): Payer: Medicaid Other

## 2013-01-16 ENCOUNTER — Encounter (HOSPITAL_COMMUNITY)
Admission: EM | Disposition: A | Discharge status: Home / Self Care | Payer: Self-pay | Source: Home / Self Care | Attending: Emergency Medicine

## 2013-01-16 ENCOUNTER — Observation Stay (HOSPITAL_COMMUNITY)
Admission: EM | Admit: 2013-01-16 | Discharge: 2013-01-17 | Disposition: A | Discharge status: Home / Self Care | Payer: Medicaid Other | Attending: Orthopedic Surgery | Admitting: Orthopedic Surgery

## 2013-01-16 ENCOUNTER — Observation Stay (HOSPITAL_COMMUNITY): Payer: Medicaid Other

## 2013-01-16 ENCOUNTER — Encounter (HOSPITAL_COMMUNITY): Payer: Self-pay | Admitting: *Deleted

## 2013-01-16 ENCOUNTER — Observation Stay (HOSPITAL_COMMUNITY): Payer: Medicaid Other | Admitting: Anesthesiology

## 2013-01-16 DIAGNOSIS — F411 Generalized anxiety disorder: Secondary | ICD-10-CM | POA: Insufficient documentation

## 2013-01-16 DIAGNOSIS — F1210 Cannabis abuse, uncomplicated: Secondary | ICD-10-CM | POA: Insufficient documentation

## 2013-01-16 DIAGNOSIS — S335XXA Sprain of ligaments of lumbar spine, initial encounter: Secondary | ICD-10-CM | POA: Insufficient documentation

## 2013-01-16 DIAGNOSIS — F17200 Nicotine dependence, unspecified, uncomplicated: Secondary | ICD-10-CM | POA: Insufficient documentation

## 2013-01-16 DIAGNOSIS — S82853A Displaced trimalleolar fracture of unspecified lower leg, initial encounter for closed fracture: Principal | ICD-10-CM | POA: Insufficient documentation

## 2013-01-16 DIAGNOSIS — I10 Essential (primary) hypertension: Secondary | ICD-10-CM | POA: Insufficient documentation

## 2013-01-16 HISTORY — PX: ORIF ANKLE FRACTURE: SHX5408

## 2013-01-16 LAB — CBC
MCH: 32.1 pg (ref 26.0–34.0)
MCHC: 34.7 g/dL (ref 30.0–36.0)
MCV: 92.4 fL (ref 78.0–100.0)
Platelets: 311 10*3/uL (ref 150–400)
RBC: 4.46 MIL/uL (ref 3.87–5.11)
RDW: 12 % (ref 11.5–15.5)

## 2013-01-16 LAB — BASIC METABOLIC PANEL
BUN: 11 mg/dL (ref 6–23)
CO2: 19 mEq/L (ref 19–32)
Calcium: 8.6 mg/dL (ref 8.4–10.5)
Creatinine, Ser: 0.66 mg/dL (ref 0.50–1.10)
GFR calc non Af Amer: >90 mL/min (ref >90)
Glucose, Bld: 94 mg/dL (ref 70–99)

## 2013-01-16 LAB — URINALYSIS, ROUTINE W REFLEX MICROSCOPIC
Glucose, UA: NEGATIVE mg/dL
Leukocytes, UA: NEGATIVE
Specific Gravity, Urine: 1.009 (ref 1.005–1.030)

## 2013-01-16 LAB — URINE MICROSCOPIC-ADD ON

## 2013-01-16 LAB — PREGNANCY, URINE: Preg Test, Ur: NEGATIVE

## 2013-01-16 SURGERY — OPEN REDUCTION INTERNAL FIXATION (ORIF) ANKLE FRACTURE
Anesthesia: General | Site: Ankle | Laterality: Right | Wound class: Clean

## 2013-01-16 MED ORDER — MORPHINE SULFATE 4 MG/ML IJ SOLN
8.0000 mg | Freq: Once | INTRAMUSCULAR | Status: AC
  Administered 2013-01-16: 8 mg via INTRAVENOUS

## 2013-01-16 MED ORDER — HYDROMORPHONE HCL PF 1 MG/ML IJ SOLN
1.0000 mg | INTRAMUSCULAR | Status: DC | PRN
  Administered 2013-01-16 – 2013-01-17 (×2): 1 mg via INTRAVENOUS
  Filled 2013-01-16 (×2): qty 1

## 2013-01-16 MED ORDER — DEXAMETHASONE SODIUM PHOSPHATE 10 MG/ML IJ SOLN
INTRAMUSCULAR | Status: DC | PRN
  Administered 2013-01-16: 10 mg via INTRAVENOUS

## 2013-01-16 MED ORDER — METHOCARBAMOL 500 MG PO TABS
ORAL_TABLET | ORAL | Status: AC
  Filled 2013-01-16: qty 1

## 2013-01-16 MED ORDER — METOCLOPRAMIDE HCL 10 MG PO TABS: 5.0000 mg | ORAL_TABLET | Freq: Three times a day (TID) | ORAL | Status: DC | PRN

## 2013-01-16 MED ORDER — KETOROLAC TROMETHAMINE 30 MG/ML IJ SOLN
INTRAMUSCULAR | Status: DC | PRN
  Administered 2013-01-16: 30 mg via INTRAVENOUS

## 2013-01-16 MED ORDER — LACTATED RINGERS IV SOLN
INTRAVENOUS | Status: DC | PRN
  Administered 2013-01-16: 13:00:00 via INTRAVENOUS

## 2013-01-16 MED ORDER — SODIUM CHLORIDE 0.9 % IV SOLN
INTRAVENOUS | Status: DC
  Administered 2013-01-16: 11:00:00 via INTRAVENOUS

## 2013-01-16 MED ORDER — ENOXAPARIN SODIUM 40 MG/0.4ML ~~LOC~~ SOLN
40.0000 mg | SUBCUTANEOUS | Status: DC
  Administered 2013-01-17: 40 mg via SUBCUTANEOUS
  Filled 2013-01-16 (×2): qty 0.4

## 2013-01-16 MED ORDER — MORPHINE SULFATE 4 MG/ML IJ SOLN: 4.0000 mg | INTRAMUSCULAR | Status: DC | PRN

## 2013-01-16 MED ORDER — AMPHETAMINE-DEXTROAMPHETAMINE 10 MG PO TABS
15.0000 mg | ORAL_TABLET | ORAL | Status: DC
  Administered 2013-01-17: 15 mg via ORAL
  Filled 2013-01-16: qty 2

## 2013-01-16 MED ORDER — ONDANSETRON HCL 4 MG/2ML IJ SOLN: 4.0000 mg | Freq: Four times a day (QID) | INTRAMUSCULAR | Status: DC | PRN

## 2013-01-16 MED ORDER — FENTANYL CITRATE 0.05 MG/ML IJ SOLN
INTRAMUSCULAR | Status: DC | PRN
  Administered 2013-01-16: 50 ug via INTRAVENOUS
  Administered 2013-01-16: 100 ug via INTRAVENOUS
  Administered 2013-01-16 (×4): 50 ug via INTRAVENOUS

## 2013-01-16 MED ORDER — MIDAZOLAM HCL 2 MG/2ML IJ SOLN
INTRAMUSCULAR | Status: AC
  Filled 2013-01-16: qty 2

## 2013-01-16 MED ORDER — ONDANSETRON HCL 4 MG/2ML IJ SOLN
INTRAMUSCULAR | Status: AC
  Filled 2013-01-16: qty 2

## 2013-01-16 MED ORDER — HYDROMORPHONE HCL PF 1 MG/ML IJ SOLN
0.2500 mg | INTRAMUSCULAR | Status: DC | PRN
  Administered 2013-01-16 (×4): 0.5 mg via INTRAVENOUS

## 2013-01-16 MED ORDER — 0.9 % SODIUM CHLORIDE (POUR BTL) OPTIME
TOPICAL | Status: DC | PRN
  Administered 2013-01-16: 1000 mL

## 2013-01-16 MED ORDER — OXYCODONE HCL 5 MG PO TABS
5.0000 mg | ORAL_TABLET | ORAL | Status: DC | PRN
  Administered 2013-01-16 – 2013-01-17 (×3): 10 mg via ORAL
  Administered 2013-01-17 (×2): 5 mg via ORAL
  Filled 2013-01-16 (×3): qty 2
  Filled 2013-01-16 (×2): qty 1

## 2013-01-16 MED ORDER — MORPHINE SULFATE 4 MG/ML IJ SOLN
4.0000 mg | Freq: Once | INTRAMUSCULAR | Status: DC
  Filled 2013-01-16: qty 1
  Filled 2013-01-16: qty 2

## 2013-01-16 MED ORDER — BISOPROLOL FUMARATE 10 MG PO TABS
10.0000 mg | ORAL_TABLET | Freq: Every day | ORAL | Status: DC
  Administered 2013-01-16 – 2013-01-17 (×2): 10 mg via ORAL
  Filled 2013-01-16 (×2): qty 1

## 2013-01-16 MED ORDER — MIDAZOLAM HCL 2 MG/2ML IJ SOLN
0.5000 mg | INTRAMUSCULAR | Status: DC | PRN
  Administered 2013-01-16: 0.5 mg via INTRAVENOUS

## 2013-01-16 MED ORDER — METHOCARBAMOL 500 MG PO TABS
500.0000 mg | ORAL_TABLET | Freq: Four times a day (QID) | ORAL | Status: DC | PRN
  Administered 2013-01-16 – 2013-01-17 (×3): 500 mg via ORAL
  Filled 2013-01-16 (×2): qty 1

## 2013-01-16 MED ORDER — METHOCARBAMOL 100 MG/ML IJ SOLN: 500.0000 mg | Freq: Four times a day (QID) | INTRAVENOUS | Status: DC | PRN

## 2013-01-16 MED ORDER — SODIUM CHLORIDE 0.9 % IV SOLN
INTRAVENOUS | Status: DC
  Administered 2013-01-16 – 2013-01-17 (×2): via INTRAVENOUS

## 2013-01-16 MED ORDER — METOCLOPRAMIDE HCL 5 MG/ML IJ SOLN: 5.0000 mg | Freq: Three times a day (TID) | INTRAMUSCULAR | Status: DC | PRN

## 2013-01-16 MED ORDER — NICOTINE 21 MG/24HR TD PT24
21.0000 mg | MEDICATED_PATCH | Freq: Every day | TRANSDERMAL | Status: DC
  Administered 2013-01-16 – 2013-01-17 (×2): 21 mg via TRANSDERMAL
  Filled 2013-01-16 (×2): qty 1

## 2013-01-16 MED ORDER — PROMETHAZINE HCL 25 MG/ML IJ SOLN: 6.2500 mg | INTRAMUSCULAR | Status: DC | PRN

## 2013-01-16 MED ORDER — INFLUENZA VIRUS VACC SPLIT PF IM SUSP
0.5000 mL | INTRAMUSCULAR | Status: DC | PRN
  Filled 2013-01-16: qty 0.5

## 2013-01-16 MED ORDER — PROPOFOL 10 MG/ML IV BOLUS
INTRAVENOUS | Status: DC | PRN
  Administered 2013-01-16: 200 mg via INTRAVENOUS
  Administered 2013-01-16: 30 mg via INTRAVENOUS

## 2013-01-16 MED ORDER — ONDANSETRON HCL 4 MG/2ML IJ SOLN
4.0000 mg | Freq: Once | INTRAMUSCULAR | Status: AC
  Administered 2013-01-16: 4 mg via INTRAVENOUS

## 2013-01-16 MED ORDER — PNEUMOCOCCAL VAC POLYVALENT 25 MCG/0.5ML IJ INJ
0.5000 mL | INJECTION | INTRAMUSCULAR | Status: DC | PRN
  Filled 2013-01-16: qty 0.5

## 2013-01-16 MED ORDER — HYDROMORPHONE HCL PF 1 MG/ML IJ SOLN
INTRAMUSCULAR | Status: AC
  Filled 2013-01-16: qty 2

## 2013-01-16 MED ORDER — MORPHINE SULFATE 4 MG/ML IJ SOLN
4.0000 mg | Freq: Once | INTRAMUSCULAR | Status: AC
  Administered 2013-01-16: 4 mg via INTRAMUSCULAR

## 2013-01-16 MED ORDER — MIDAZOLAM HCL 5 MG/5ML IJ SOLN
INTRAMUSCULAR | Status: DC | PRN
  Administered 2013-01-16: 2 mg via INTRAVENOUS

## 2013-01-16 MED ORDER — CEFAZOLIN SODIUM-DEXTROSE 2-3 GM-% IV SOLR
2.0000 g | Freq: Once | INTRAVENOUS | Status: AC
  Administered 2013-01-16: 2 g via INTRAVENOUS

## 2013-01-16 MED ORDER — ONDANSETRON HCL 4 MG/2ML IJ SOLN
INTRAMUSCULAR | Status: DC | PRN
  Administered 2013-01-16: 4 mg via INTRAVENOUS

## 2013-01-16 MED ORDER — SUCCINYLCHOLINE CHLORIDE 20 MG/ML IJ SOLN
INTRAMUSCULAR | Status: DC | PRN
  Administered 2013-01-16: 100 mg via INTRAVENOUS

## 2013-01-16 MED ORDER — LACTATED RINGERS IV SOLN: INTRAVENOUS | Status: DC

## 2013-01-16 MED ORDER — CEFAZOLIN SODIUM-DEXTROSE 2-3 GM-% IV SOLR
INTRAVENOUS | Status: AC
  Filled 2013-01-16: qty 50

## 2013-01-16 MED ORDER — MEPERIDINE HCL 50 MG/ML IJ SOLN: 6.2500 mg | INTRAMUSCULAR | Status: DC | PRN

## 2013-01-16 MED ORDER — CEFAZOLIN SODIUM-DEXTROSE 2-3 GM-% IV SOLR: 2.0000 g | Freq: Once | INTRAVENOUS | Status: DC

## 2013-01-16 SURGICAL SUPPLY — 47 items
BAG SPEC THK2 15X12 ZIP CLS (MISCELLANEOUS) ×1
BAG ZIPLOCK 12X15 (MISCELLANEOUS) ×2 IMPLANT
BANDAGE ELASTIC 4 VELCRO ST LF (GAUZE/BANDAGES/DRESSINGS) ×2 IMPLANT
BANDAGE ELASTIC 6 VELCRO ST LF (GAUZE/BANDAGES/DRESSINGS) ×1 IMPLANT
BANDAGE GAUZE ELAST BULKY 4 IN (GAUZE/BANDAGES/DRESSINGS) ×1 IMPLANT
BIT DRILL 2.5X110 QC LCP DISP (BIT) ×1 IMPLANT
CLOTH BEACON ORANGE TIMEOUT ST (SAFETY) ×2 IMPLANT
CUFF TOURN SGL QUICK 34 (TOURNIQUET CUFF) ×2
CUFF TRNQT CYL 34X4X40X1 (TOURNIQUET CUFF) ×1 IMPLANT
DRAPE C-ARM 42X72 X-RAY (DRAPES) ×2 IMPLANT
DRAPE OEC MINIVIEW 54X84 (DRAPES) ×1 IMPLANT
DRAPE U-SHAPE 47X51 STRL (DRAPES) ×2 IMPLANT
DRSG PAD ABDOMINAL 8X10 ST (GAUZE/BANDAGES/DRESSINGS) ×2 IMPLANT
DURAPREP 26ML APPLICATOR (WOUND CARE) ×3 IMPLANT
ELECT REM PT RETURN 9FT ADLT (ELECTROSURGICAL) ×2
ELECTRODE REM PT RTRN 9FT ADLT (ELECTROSURGICAL) ×1 IMPLANT
GAUZE XEROFORM 1X8 LF (GAUZE/BANDAGES/DRESSINGS) ×2 IMPLANT
GAUZE XEROFORM 5X9 LF (GAUZE/BANDAGES/DRESSINGS) ×2 IMPLANT
GLOVE ORTHO TXT STRL SZ7.5 (GLOVE) ×2 IMPLANT
GLOVE SURG ORTHO 8.5 STRL (GLOVE) ×2 IMPLANT
GOWN STRL NON-REIN LRG LVL3 (GOWN DISPOSABLE) ×4 IMPLANT
K-WIRE 1.6X150 (WIRE) ×2
KWIRE 1.6X150 (WIRE) IMPLANT
MANIFOLD NEPTUNE II (INSTRUMENTS) ×2 IMPLANT
PACK LOWER EXTREMITY WL (CUSTOM PROCEDURE TRAY) ×2 IMPLANT
PAD CAST 4YDX4 CTTN HI CHSV (CAST SUPPLIES) ×2 IMPLANT
PADDING CAST COTTON 4X4 STRL (CAST SUPPLIES) ×2
PADDING CAST COTTON 6X4 STRL (CAST SUPPLIES) ×4 IMPLANT
PLATE W-COLLAR 5MM (Plate) ×1 IMPLANT
POSITIONER SURGICAL ARM (MISCELLANEOUS) ×2 IMPLANT
SCREW 3.5MM 14MM (Screw) ×3 IMPLANT
SCREW CANC TI PT 4.0X45 (Screw) ×2 IMPLANT
SPLINT PLASTER CAST XFAST 5X30 (CAST SUPPLIES) IMPLANT
SPLINT PLASTER XFAST SET 5X30 (CAST SUPPLIES) ×2
SPONGE GAUZE 4X4 12PLY (GAUZE/BANDAGES/DRESSINGS) ×3 IMPLANT
SPONGE LAP 18X18 X RAY DECT (DISPOSABLE) ×2 IMPLANT
SPONGE LAP 4X18 X RAY DECT (DISPOSABLE) ×2 IMPLANT
STAPLER VISISTAT 35W (STAPLE) ×1 IMPLANT
SUT ETHILON 4 0 PS 2 18 (SUTURE) ×4 IMPLANT
SUT PROLENE 3 0 FS 2 (SUTURE) ×2 IMPLANT
SUT VIC AB 1 CT1 27 (SUTURE) ×4
SUT VIC AB 1 CT1 27XBRD ANTBC (SUTURE) ×2 IMPLANT
SUT VIC AB 2-0 CT1 27 (SUTURE) ×2
SUT VIC AB 2-0 CT1 TAPERPNT 27 (SUTURE) ×1 IMPLANT
SUT VIC AB 2-0 CT2 27 (SUTURE) ×1 IMPLANT
TOWEL OR 17X26 10 PK STRL BLUE (TOWEL DISPOSABLE) ×4 IMPLANT
YANKAUER SUCT BULB TIP 10FT TU (MISCELLANEOUS) ×1 IMPLANT

## 2013-01-16 NOTE — Anesthesia Postprocedure Evaluation (Signed)
  Anesthesia Post-op Note  Patient: [name]  Procedure(s) Performed: Procedure(s): OPEN REDUCTION INTERNAL FIXATION (ORIF) ANKLE FRACTURE (Right)  Patient Location: PACU  Anesthesia Type:General  Level of Consciousness: awake, alert  and oriented  Airway and Oxygen Therapy: Patient Spontanous Breathing  Post-op Pain: mild  Post-op Assessment: Post-op Vital signs reviewed, Patient's Cardiovascular Status Stable, Respiratory Function Stable, Patent Airway, No signs of Nausea or vomiting and Pain level controlled  Post-op Vital Signs: Reviewed and stable  Complications: No apparent anesthesia complications
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
## 2013-01-16 NOTE — ED Provider Notes (Signed)
Right knee and proximal fibular nontender, right foot his capillary less than 2 seconds normal light touch including the first dorsal webspace she is able to dorsiflex and plantar flex her toes right ankle is diffusely tender and somewhat swollen with skin intact I doubt compartment syndrome prior to ED splinting.  D/w Ortho requests no ED splint until seen by Ortho since likely to OR today.   Patient will be admitted to orthopedics for surgery later today.  Hurman Horn, MD 01/17/13 [phone]
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
## 2013-01-16 NOTE — Progress Notes (Signed)
Transported to holding for OR

## 2013-01-16 NOTE — Anesthesia Preprocedure Evaluation (Addendum)
Anesthesia Evaluation  Patient identified by MRN, date of birth, ID band Patient awake    Reviewed: Allergy & Precautions, H&P , NPO status , Patient's Chart, lab work & pertinent test results, reviewed documented beta blocker date and time   Airway Mallampati: III TM Distance: >3 FB Neck ROM: Full    Dental no notable dental hx. (+) Teeth Intact   Pulmonary Current Smoker,  breath sounds clear to auscultation  Pulmonary exam normal       Cardiovascular hypertension, Pt. on home beta blockers and Pt. on medications Rhythm:Regular     Neuro/Psych Anxiety ADHD    GI/Hepatic negative GI ROS, (+)     substance abuse  alcohol use and marijuana use,   Endo/Other  negative endocrine ROSObesity  Renal/GU negative Renal ROS  negative genitourinary   Musculoskeletal   Abdominal   Peds  Hematology negative hematology ROS (+)   Anesthesia Other Findings   Reproductive/Obstetrics negative OB ROS                          Anesthesia Physical Anesthesia Plan  ASA: II  Anesthesia Plan: General   Post-op Pain Management:    Induction: Intravenous and Cricoid pressure planned  Airway Management Planned: Oral ETT  Additional Equipment:   Intra-op Plan:   Post-operative Plan: Extubation in OR  Informed Consent: I have reviewed the patients History and Physical, chart, labs and discussed the procedure including the risks, benefits and alternatives for the proposed anesthesia with the patient or authorized representative who has indicated his/her understanding and acceptance.   Dental advisory given  Plan Discussed with: CRNA, Anesthesiologist and Surgeon  Anesthesia Plan Comments:         Anesthesia Quick Evaluation

## 2013-01-16 NOTE — ED Notes (Signed)
Patient transported to CT 

## 2013-01-16 NOTE — Transfer of Care (Signed)
Immediate Anesthesia Transfer of Care Note  Patient: [name]  Procedure(s) Performed: Procedure(s): OPEN REDUCTION INTERNAL FIXATION (ORIF) ANKLE FRACTURE (Right)  Patient Location: PACU  Anesthesia Type:General  Level of Consciousness: awake, alert , oriented and patient cooperative  Airway & Oxygen Therapy: Patient Spontanous Breathing and Patient connected to face mask oxygen  Post-op Assessment: Report given to PACU RN and Post -op Vital signs reviewed and stable  Post vital signs: Reviewed and stable  Complications: No apparent anesthesia complications
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
## 2013-01-16 NOTE — ED Notes (Signed)
Attempted IVx 2 and failed.

## 2013-01-16 NOTE — Brief Op Note (Signed)
01/16/2013  2:45 PM  PATIENT:  [name]  32 y.o. female  PRE-OPERATIVE DIAGNOSIS:  right ankle fracture  POST-OPERATIVE DIAGNOSIS:  right ankle fracture  PROCEDURE:  Procedure(s): OPEN REDUCTION INTERNAL FIXATION (ORIF) ANKLE FRACTURE (Right)  SURGEON:  Surgeon(s) and Role:    * Verlee Rossetti, MD - Primary  PHYSICIAN ASSISTANT:   ASSISTANTS: Standley Dakins PA-c    ANESTHESIA:   general  EBL:  Total I/O In: -  Out: 25 [Blood:25]  BLOOD ADMINISTERED:none  DRAINS: none   LOCAL MEDICATIONS USED:  NONE  SPECIMEN:  No Specimen  DISPOSITION OF SPECIMEN:  N/A  COUNTS:  YES  TOURNIQUET:  * Missing tourniquet times found for documented tourniquets in log:  16109 *  DICTATION: .pending  PLAN OF CARE: Admit for overnight observation  PATIENT DISPOSITION:  PACU - hemodynamically stable.   Delay start of Pharmacological VTE agent (>24hrs) due to surgical blood loss or risk of bleeding: yes
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
## 2013-01-17 ENCOUNTER — Encounter (HOSPITAL_COMMUNITY): Payer: Self-pay | Admitting: Orthopedic Surgery

## 2013-01-17 MED ORDER — OXYCODONE HCL 5 MG PO TABS: 5.0000 mg | ORAL_TABLET | ORAL | Status: DC | PRN

## 2013-01-17 MED ORDER — METHOCARBAMOL 500 MG PO TABS: 500.0000 mg | ORAL_TABLET | Freq: Four times a day (QID) | ORAL | Status: DC | PRN
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
## 2013-01-17 NOTE — Progress Notes (Signed)
Patient declined rolling walker for home use at this time.
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
## 2013-01-17 NOTE — Progress Notes (Signed)
Order for DME wheelchair for home use rec'd.  Will be delivered to patients home upon discharge.
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
## 2013-01-17 NOTE — Progress Notes (Signed)
   Subjective: 1 Day Post-Op Procedure(s) (LRB): OPEN REDUCTION INTERNAL FIXATION (ORIF) ANKLE FRACTURE (Right)   Patient reports pain as mild to right ankle but otherwise doing well Therapy went well this am Denies any other symptoms  Objective:   VITALS:   Filed Vitals:   01/17/13 1023  BP:   Pulse:   Temp: 98.3 F (36.8 C)  Resp:    Cast intact Neurologically intact  LABS  Recent Labs  01/16/13 1014  HGB 14.3  HCT 41.2  WBC 18.5*  PLT 311     Recent Labs  01/16/13 1014  NA 136  K 3.7  BUN 11  CREATININE 0.66  GLUCOSE 94     Assessment/Plan: 1 Day Post-Op Procedure(s) (LRB): OPEN REDUCTION INTERNAL FIXATION (ORIF) ANKLE FRACTURE (Right)   D/c home F/u in 1-2 weeks for cast placement Pain control   Brad Dixon, MPAS, PA-C  01/17/2013, 11:05 AM
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
## 2013-11-03 ENCOUNTER — Encounter: Payer: Self-pay | Admitting: Physician Assistant

## 2013-11-03 DIAGNOSIS — I10 Essential (primary) hypertension: Secondary | ICD-10-CM

## 2013-11-03 DIAGNOSIS — F988 Other specified behavioral and emotional disorders with onset usually occurring in childhood and adolescence: Secondary | ICD-10-CM | POA: Insufficient documentation

## 2013-11-04 ENCOUNTER — Encounter: Payer: Self-pay | Admitting: Physician Assistant

## 2013-11-04 ENCOUNTER — Ambulatory Visit (INDEPENDENT_AMBULATORY_CARE_PROVIDER_SITE_OTHER): Payer: Self-pay | Admitting: Physician Assistant

## 2013-11-04 VITALS — BP 132/78 | HR 68 | Temp 98.6°F | Resp 16 | Ht 68.0 in | Wt 213.0 lb

## 2013-11-04 DIAGNOSIS — E669 Obesity, unspecified: Secondary | ICD-10-CM

## 2013-11-04 DIAGNOSIS — I10 Essential (primary) hypertension: Secondary | ICD-10-CM

## 2013-11-04 DIAGNOSIS — F988 Other specified behavioral and emotional disorders with onset usually occurring in childhood and adolescence: Secondary | ICD-10-CM

## 2013-11-04 LAB — TSH: TSH: 4.135 u[IU]/mL (ref 0.350–4.500)

## 2013-11-04 MED ORDER — BISOPROLOL-HYDROCHLOROTHIAZIDE 5-6.25 MG PO TABS
1.0000 | ORAL_TABLET | Freq: Every day | ORAL | Status: DC
Start: 2013-11-04 — End: 2014-03-02

## 2013-11-04 MED ORDER — PHENTERMINE HCL 37.5 MG PO TABS: 37.5000 mg | ORAL_TABLET | Freq: Every day | ORAL | Status: DC

## 2013-11-04 NOTE — Progress Notes (Signed)
HPI Patient presents for a follow up. She is seeing [facility] and she is now taking Vyvanse, Gabapentin, and lamictal, she states she is doing well with all of the other medications but states that the Vyvanse wears off too quickly and would like to try something else when she goes back to see her in Feb. She states she has been gaining weight and feels that since she broke her right ankle in march that she has been gaining weight. She is not working right now, she stays at home with her daughter. She would like to try a diet pill. She has been off her BP med since Sunday.   Past Medical History  Diagnosis Date  . Hypertension   . Anxiety   . S/P cesarean section 01/25/2012  . ADD (attention deficit disorder)   . Drug abuse in remission      Allergies  Allergen Reactions  . Celexa [Citalopram Hydrobromide]   . Wellbutrin [Bupropion]     Current Outpatient Prescriptions on File Prior to Visit  Medication Sig Dispense Refill  . bisoprolol (ZEBETA) 10 MG tablet Take 10 mg by mouth daily.       No current facility-administered medications on file prior to visit.    ROS: all negative expect above.   Physical: Filed Weights   11/04/13 0944  Weight: 213 lb (96.616 kg)   Filed Vitals:   11/04/13 0944  BP: 132/78  Pulse: 68  Temp: 98.6 F (37 C)  Resp: 16   General Appearance: Well nourished, in no apparent distress. Eyes: PERRLA, EOMs. Sinuses: No Frontal/maxillary tenderness ENT/Mouth: Ext aud canals clear, normal light reflex with TMs without erythema, bulging. Post pharynx without erythema, swelling, exudate.  Respiratory: CTAB Cardio: RRR, no murmurs, rubs or gallops. Peripheral pulses brisk and equal bilaterally, without edema. No aortic or femoral bruits. Abdomen: Soft, with bowl sounds. Nontender, no guarding, rebound. Lymphatics: Non tender without lymphadenopathy.  Musculoskeletal: Full ROM all peripheral extremities, 5/5 strength, and normal gait. Skin: Warm, dry  without rashes, lesions, ecchymosis.  Neuro: Cranial nerves intact, reflexes equal bilaterally. Normal muscle tone, no cerebellar symptoms. Sensation intact.  Pysch: Awake and oriented X 3, normal affect, Insight and Judgment appropriate.   Assessment and Plan: 1) HTN- Ziac 5 refills and suggest finding a medicaid physician.  2) ADD- continue to see [facility] 3) Weight gain/obesity- check TSH since post partum and give phenteramine 37.5 #30 NR.

## 2013-11-04 NOTE — Patient Instructions (Signed)

## 2013-11-06 MED ORDER — LEVOTHYROXINE SODIUM 50 MCG PO TABS: 50.0000 ug | ORAL_TABLET | Freq: Every day | ORAL | Status: DC

## 2013-11-06 NOTE — Addendum Note (Signed)
Addended by: Quentin Mulling R on: 11/06/2013 08:11 PM   Modules accepted: Orders

## 2013-11-07 ENCOUNTER — Other Ambulatory Visit: Payer: Self-pay | Admitting: Internal Medicine

## 2013-12-02 ENCOUNTER — Other Ambulatory Visit: Payer: Self-pay

## 2013-12-02 ENCOUNTER — Other Ambulatory Visit: Payer: Self-pay | Admitting: Physician Assistant

## 2013-12-02 DIAGNOSIS — E039 Hypothyroidism, unspecified: Secondary | ICD-10-CM

## 2013-12-05 ENCOUNTER — Other Ambulatory Visit: Payer: Self-pay

## 2013-12-15 ENCOUNTER — Other Ambulatory Visit: Payer: Self-pay | Admitting: Physician Assistant

## 2013-12-16 ENCOUNTER — Other Ambulatory Visit: Payer: Self-pay

## 2013-12-16 DIAGNOSIS — E039 Hypothyroidism, unspecified: Secondary | ICD-10-CM

## 2013-12-16 LAB — TSH: TSH: 3.859 u[IU]/mL (ref 0.350–4.500)

## 2014-03-02 ENCOUNTER — Emergency Department (HOSPITAL_COMMUNITY)
Admission: EM | Admit: 2014-03-02 | Discharge: 2014-03-02 | Disposition: A | Discharge status: Home / Self Care | Payer: Medicaid Other | Attending: Emergency Medicine | Admitting: Emergency Medicine

## 2014-03-02 ENCOUNTER — Encounter (HOSPITAL_COMMUNITY): Payer: Self-pay | Admitting: Emergency Medicine

## 2014-03-02 DIAGNOSIS — F988 Other specified behavioral and emotional disorders with onset usually occurring in childhood and adolescence: Secondary | ICD-10-CM | POA: Insufficient documentation

## 2014-03-02 DIAGNOSIS — R110 Nausea: Secondary | ICD-10-CM | POA: Insufficient documentation

## 2014-03-02 DIAGNOSIS — Z79899 Other long term (current) drug therapy: Secondary | ICD-10-CM | POA: Insufficient documentation

## 2014-03-02 DIAGNOSIS — F17200 Nicotine dependence, unspecified, uncomplicated: Secondary | ICD-10-CM | POA: Insufficient documentation

## 2014-03-02 DIAGNOSIS — I10 Essential (primary) hypertension: Secondary | ICD-10-CM | POA: Insufficient documentation

## 2014-03-02 DIAGNOSIS — K089 Disorder of teeth and supporting structures, unspecified: Secondary | ICD-10-CM | POA: Insufficient documentation

## 2014-03-02 DIAGNOSIS — K0889 Other specified disorders of teeth and supporting structures: Secondary | ICD-10-CM

## 2014-03-02 DIAGNOSIS — H9209 Otalgia, unspecified ear: Secondary | ICD-10-CM | POA: Insufficient documentation

## 2014-03-02 DIAGNOSIS — R509 Fever, unspecified: Secondary | ICD-10-CM | POA: Insufficient documentation

## 2014-03-02 MED ORDER — ACETAMINOPHEN-CODEINE #3 300-30 MG PO TABS: 1.0000 | ORAL_TABLET | Freq: Four times a day (QID) | ORAL | Status: DC | PRN

## 2014-03-02 MED ORDER — IBUPROFEN 800 MG PO TABS: 800.0000 mg | ORAL_TABLET | Freq: Three times a day (TID) | ORAL | Status: DC | PRN

## 2014-03-02 MED ORDER — PENICILLIN V POTASSIUM 500 MG PO TABS: 500.0000 mg | ORAL_TABLET | Freq: Four times a day (QID) | ORAL | Status: DC

## 2014-03-02 NOTE — Discharge Instructions (Signed)
Read the information below.  Use the prescribed medication as directed.  Please discuss all new medications with your pharmacist.  Do not take additional tylenol while taking the prescribed pain medication to avoid overdose.  You may return to the Emergency Department at any time for worsening condition or any new symptoms that concern you.  If there is any possibility that you might be pregnant, please let your health care provider know and discuss this with the pharmacist to ensure medication safety.  Please call the dentist listed above within 48 hours to schedule a close follow up appointment.  If you develop fevers, swelling in your face, difficulty swallowing or breathing, return to the ER immediately for a recheck.   ° ° °Dental Pain °A tooth ache may be caused by cavities (tooth decay). Cavities expose the nerve of the tooth to air and hot or cold temperatures. It may come from an infection or abscess (also called a boil or furuncle) around your tooth. It is also often caused by dental caries (tooth decay). This causes the pain you are having. °DIAGNOSIS  °Your caregiver can diagnose this problem by exam. °TREATMENT  °· If caused by an infection, it may be treated with medications which kill germs (antibiotics) and pain medications as prescribed by your caregiver. Take medications as directed. °· Only take over-the-counter or prescription medicines for pain, discomfort, or fever as directed by your caregiver. °· Whether the tooth ache today is caused by infection or dental disease, you should see your dentist as soon as possible for further care. °SEEK MEDICAL CARE IF: °The exam and treatment you received today has been provided on an emergency basis only. This is not a substitute for complete medical or dental care. If your problem worsens or new problems (symptoms) appear, and you are unable to meet with your dentist, call or return to this location. °SEEK IMMEDIATE MEDICAL CARE IF:  °· You have a  fever. °· You develop redness and swelling of your face, jaw, or neck. °· You are unable to open your mouth. °· You have severe pain uncontrolled by pain medicine. °MAKE SURE YOU:  °· Understand these instructions. °· Will watch your condition. °· Will get help right away if you are not doing well or get worse. °Document Released: 11/03/2005 Document Revised: 01/26/2012 Document Reviewed: 06/21/2008 °ExitCare® Patient Information ©2014 [employer]. ° ° ° °Emergency Department Resource Guide °1) Find a Doctor and Pay Out of Pocket °Although you won't have to find out who is covered by your insurance plan, it is a good idea to ask around and get recommendations. You will then need to call the office and see if the doctor you have chosen will accept you as a new patient and what types of options they offer for patients who are self-pay. Some doctors offer discounts or will set up payment plans for their patients who do not have insurance, but you will need to ask so you aren't surprised when you get to your appointment. ° °2) Contact Your Local Health Department °Not all health departments have doctors that can see patients for sick visits, but many do, so it is worth a call to see if yours does. If you don't know where your local health department is, you can check in your phone book. The CDC also has a tool to help you locate your state's health department, and many state websites also have listings of all of their local health departments. ° °3) Find a Walk-in   Clinic °If your illness is not likely to be very severe or complicated, you may want to try a walk in clinic. These are popping up all over the country in pharmacies, drugstores, and shopping centers. They're usually staffed by nurse practitioners or physician assistants that have been trained to treat common illnesses and complaints. They're usually fairly quick and inexpensive. However, if you have serious medical issues or chronic medical problems, these  are probably not your best option. ° °No Primary Care Doctor: °- Call [facility] at  832-8000 - they can help you locate a primary care doctor that  accepts your insurance, provides certain services, etc. °- Physician Referral Service- 1-800-533-3463 ° °Chronic Pain Problems: °Organization         Address  Phone   Notes  °[facility]  (336) 297-2271 Patients need to be referred by their primary care doctor.  ° °Medication Assistance: °Organization         Address  Phone   Notes  °[place] [facility] [address] °[place], [place] [address] (336) 641-8030 --Must be a resident of [place] °-- Must have NO insurance coverage whatsoever (no Medicaid/ Medicare, etc.) °-- The pt. MUST have a primary care doctor that directs their care regularly and follows them in the community °  °[facility]  (866) 331-1348   °[employer]  (888) 892-1162   ° °Agencies that provide inexpensive medical care: °Organization         Address  Phone   Notes  °[facility]  (336) 832-8035   °[facility]    (336) 832-7272   °[facility] [address] °[place], [place] [address] (336) 832-4777   °[facility] [address], °[place] (336) 271-4999   °[facility]    (336) 373-0678   °[facility]    (336) 272-1050   °[facility] ° [address], [place] Phone:  (336) 832-4444, Fax:  (336) 832-4440 Hours of Operation:  9 am - 6 pm, M-F.  Also accepts Medicaid/Medicare and self-pay.  °[facility] ° [address], [place] Phone: (336) 832-3150, Fax: (336) 832-3151. Hours of Operation:  8:30 am - 5:30 pm, M-F.  Also accepts Medicaid and self-pay.  °[facility] [place] [address], [place] Phone: (336) 878-6027   °[facility] [address], [place], [place] (336)723-1848, Ext. 123 Mondays &  Thursdays: 7-9 AM.  First 15 patients are seen on a first come, first serve basis. °  ° °Medicaid-accepting [place] Providers: ° °Organization         Address  Phone   Notes  °[facility] [address], [place] (336) 641-2100 Also accepts self-pay patients.  °[facility] [address], [place] ° (336) 856-9996   °[facility] [address], [place] (336) 288-8857   °[facility] [address] [place] [address], [place] (336) 299-7000   °Veita Bland [address], [place]  ° (336) 373-1557 Only accepts [place] Access Medicaid patients after they have their name applied to their card.  ° °Self-Pay (no insurance) in [place]: ° °Organization         Address  Phone   Notes  °Sickle Cell Patients, [facility] [address], [place] (  336) 832-1970   °[facility] [address], [place] (336) 832-4400   °[facility] ° [address], [place] (336) 992-4800   °[facility]/Dr. Osei-Bonsu ° [address] [place] [address], [place] or [address], [place] (336) 841-8500 Phone number for both [place] and [place] locations is the same.  °[facility] [address], [place] (336) 299-0000   °[facility] [address] [place] [address], [place] or [address] (336) 852-7530 °(336) 878-2260   °[facility] [address], [place] (336) 350-1642, phone; (336) 294-5005, fax Sees patients 1st and 3rd Saturday of every month.  Must not qualify for public or private insurance (i.e. Medicaid, Medicare, [place] Health Choice, Veterans' Benefits) • Household income should be no more than 200% of the poverty level •The clinic cannot treat you if you are pregnant or think you are pregnant • Sexually transmitted diseases are not treated at the  clinic.  ° ° °Dental Care: °Organization         Address  Phone  Notes  °[facility] [address], [place] (336) 641-6152 Accepts children up to age 21 who are enrolled in Medicaid or [place] Health Choice; pregnant women with a Medicaid card; and children who have applied for Medicaid or [place] Health Choice, but were declined, whose parents can pay a reduced fee at time of service.  °[facility] [place]  [address], [place] (336) 641-7733 Accepts children up to age 21 who are enrolled in Medicaid or [place] Health Choice; pregnant women with a Medicaid card; and children who have applied for Medicaid or [place] Health Choice, but were declined, whose parents can pay a reduced fee at time of service.  °[facility] ° [address], [place] (336) 641-4533 Patients are seen by appointment only. Walk-ins are not accepted. [facility] will see patients 18 years of age and older. °Monday - Tuesday (8am-5pm) °Most Wednesdays (8:30-5pm) °$30 per visit, cash only  °[facility] ° [address], [place] (336) 641-4533 Patients are seen by appointment only. Walk-ins are not accepted. [facility] will see patients 18 years of age and older. °One Wednesday Evening (Monthly: Volunteer Based).  $30 per visit, cash only  °[facility]  (919) 537-3737 for adults; Children under age 4, call Graduate Pediatric Dentistry at (919) [phone]. Children aged 4-14, please call (919) 537-3737 to request a pediatric application. ° Dental services are provided in all areas of dental care including fillings, crowns and bridges, complete and partial dentures, implants, gum treatment, root canals, and extractions. Preventive care is also provided. Treatment is provided to both adults and children. °Patients are selected via a lottery and there is often a  waiting list. °  °[facility] [address], ° ° (336) 763-8833 [email or web address] °  °[facility] [address], [place], [place] (336)723-1848, Ext. 123 Second and Fourth Thursday of each month, opens at 6:30 AM; Clinic ends at 9 AM.  Patients are seen on a first-come first-served basis, and a limited number are seen during each clinic.  ° °[facility] ° [address], [place], [place] (336) 723-7904   Eligibility Requirements °You must have lived in [place], [place], or [place] counties   for at least the last three months. °  You cannot be eligible for state or federal sponsored healthcare insurance, including Veterans Administration, Medicaid, or Medicare. °  You generally cannot be eligible for healthcare insurance through your employer.  °  How to apply: °Eligibility screenings are held every Tuesday and Wednesday afternoon from 1:00 pm until 4:00 pm. You do not need an appointment for the interview!  °[facility] [address], [place], [place] 336-631-2330   °[facility]  336-342-8273   °[facility]  336-703-3100   °[facility]  336-570-6415   ° °Behavioral Health Resources in the Community: °Intensive Outpatient Programs °Organization         Address  Phone  Notes  °[facility] [address], [place], [place] 336-878-6098   °[facility] [address], [place], [place] 336-832-9800   °[facility] [address], [place], [place] ° 336-882-2125   °[facility] [address],  °[place], [place] 1-800-853-5163 or 336-641-4981   °Substance Abuse Resources °Organization         Address  Phone  Notes  °[facility]  336-882-2125   °[facility]  336-784-9470   °[facility]  336-285-9073   °[facility]  336-845-3988   °Residential & Outpatient Substance Abuse  Program  1-800-659-3381   °Psychological Services °Organization         Address  Phone  Notes  °[facility]  336- 832-9600   °[facility]  336- 378-7881   °[facility] [address], [place] 1-800-853-5163 or 336-641-4981   ° °Mobile Crisis Teams °Organization         Address  Phone  Notes  °[facility], Mobile Crisis Care Unit  1-877-626-1772   °Assertive °[facility] ° [address] [place], [place] 336-834-9664   °Sharon DeEsch [address] °[place] 336-554-5454   ° °Self-Help/Support Groups °Organization         Address  Phone             Notes  °[facility] - variety of support groups  336- 373-1402 Call for more information  °Narcotics Anonymous (NA), [facility] [address], °[place]  2 meetings at this location  ° °Residential Treatment Programs °Organization         Address  Phone  Notes  °[facility] [address],    °[place]  1-866-801-8205   °[facility] ° [address], [place], [place] 704-293-8524   °[facility] [address], [place] 336-845-3988 Admissions: 8am-3pm M-F  °[facility] [address]    °[place], [place] 336-841-1104   °[facility] [address], [place], [place] 336-379-7146   °[facility] [address]  °[place], [place] 336-285-9073   °[facility] Intensive Outpatient [address], [place], [place] 336-852-3033   °[facility] [address]  °[place], [place] 1-877-615-2722 or 336-784-9470   °[facility] [address] [place], [place] 336-227-7417 Accepts Medicaid  °[facility] [address]  °[place] 1-800-659-3381 Substance Abuse/Addiction Treatment  ° °[place] Behavioral Health Resources °Organization          Address  Phone  Notes  °[facility]  (  888) 581-9988   °Julie Brannon, PhD [address] [place], [place]   (336) 349-5553 or (336) 951-0000   °[facility]   [address] °[place], [place] (336) 349-4454   °[facility] [address], [place], [place] (336) 342-8316 Insurance/Medicaid/sponsorship through [facility]  °[facility] [address]                                    [place], [place] (336) 342-8316 Therapy/tele-psych/case  °[facility] [address]  ° [place], [place] (336) 349-2233    °Dr. Arfeen  (336) 349-4544   °[facility] [place]  [employer] [place] [facility] 1) [address], [place] °2) [address], [place] °3)  [address], [place] (336) 349-3220 °(336) 342-7768 ° °(336) 342-8140   °[place] [facility] (336) 342-1394 or (336) 342-3537 (After Hours)    ° ° ° °

## 2014-03-02 NOTE — ED Provider Notes (Signed)
CSN: [id number]     Arrival date & time 03/02/14  1129 History   This chart was scribed for non-physician practitioner working with Enid SkeensJoshua M Zavitz, MD by Ashley JacobsBrittany Andrews, ED scribe. This patient was seen in room WTR8/WTR8 and the patient's care was started at 12:25 PM.  First MD Initiated Contact with Patient 03/02/14 1223     Chief Complaint  Patient presents with  . Dental Pain    4 day hx of tooth pain     (Consider location/radiation/quality/duration/timing/severity/associated sxs/prior Treatment) Patient is a 33 y.o. female presenting with tooth pain. The history is provided by the patient and medical records. No language interpreter was used.  Dental Pain Location:  Lower Lower teeth location:  17/LL 3rd molar Quality:  Throbbing Severity:  Moderate Onset quality:  Sudden Duration:  4 days Timing:  Constant Progression:  Worsening Chronicity:  New Context: not dental fracture and not trauma   Relieved by:  Nothing Worsened by:  Touching, cold food/drink, hot food/drink, jaw movement and pressure Associated symptoms: difficulty swallowing, facial pain, facial swelling and gum swelling   Associated symptoms: no fever and no neck swelling   Risk factors: lack of dental care and smoking    HPI Comments: [name] is a 33 y.o. female who presents to the Emergency Department complaining of left constant, progressively worsening, left lower dental pain for the past four days.  She denies any known injury or fracture. The pain is worse with chewing and touch. The pain radiates to her lower mandible. Pt has tried over the counter pain medications to no relief.  She has mild facial swelling, mild left ear pain, and mild trouble with swallowing. Pt had a prior dental complications to a tooth proximal to the affected area.  Pt has the associated symptom of fever and nausea. However she speculates this may be due to excessive sun exposure from a recent beach trip. Past Medical History   Diagnosis Date  . Hypertension   . Anxiety   . S/P cesarean section 01/25/2012  . ADD (attention deficit disorder)   . Drug abuse in remission    Past Surgical History  Procedure Laterality Date  . No past surgeries    . Cesarean section  01/23/2012    Procedure: CESAREAN SECTION;  Surgeon: Lavina Hammanodd Meisinger, MD;  Location: [facility];  Service: Gynecology;  Laterality: N/A;  . Orif ankle fracture Right 01/16/2013    Procedure: OPEN REDUCTION INTERNAL FIXATION (ORIF) ANKLE FRACTURE;  Surgeon: Verlee RossettiSteven R Norris, MD;  Location: [facility];  Service: Orthopedics;  Laterality: Right;   Family History  Problem Relation Age of Onset  . Hypertension Father    History  Substance Use Topics  . Smoking status: Current Every Day Smoker -- 1.00 packs/day for 16 years    Types: Cigarettes  . Smokeless tobacco: Never Used  . Alcohol Use: Yes     Comment: occassional wine during pregnancy   OB History   Grav Para Term Preterm Abortions TAB SAB Ect Mult Living   1 1 1  0 0 0 0 0 0 1     Review of Systems  Constitutional: Negative for fever.  HENT: Positive for dental problem, ear pain and facial swelling. Negative for trouble swallowing.        Gum and mandibular pain  Respiratory: Negative for shortness of breath.   Gastrointestinal: Positive for nausea. Negative for vomiting.  All other systems reviewed and are negative.     Allergies  Celexa  and Wellbutrin  Home Medications   Prior to Admission medications   Medication Sig Start Date End Date Taking? Authorizing Provider  bisoprolol (ZEBETA) 10 MG tablet Take 10 mg by mouth daily.   Yes Historical Provider, MD  ibuprofen (ADVIL,MOTRIN) 800 MG tablet Take 800 mg by mouth every 8 (eight) hours as needed for moderate pain.   Yes Historical Provider, MD  levothyroxine (SYNTHROID, LEVOTHROID) 50 MCG tablet Take 1 tablet (50 mcg total) by mouth daily before breakfast. 11/06/13  Yes Quentin MullingAmanda Collier, PA-C  lisdexamfetamine (VYVANSE) 40 MG capsule Take  40 mg by mouth every morning.   Yes Historical Provider, MD   BP 130/82  Pulse 77  Temp(Src) 98.2 F (36.8 C) (Oral)  Resp 20  SpO2 100%  LMP 03/01/2014 Physical Exam  Nursing note and vitals reviewed. Constitutional: She is oriented to person, place, and time. She appears well-developed and well-nourished. No distress.  HENT:  Head: Normocephalic.  Mouth/Throat: Oropharynx is clear and moist. No oropharyngeal exudate.  Left lower third molar tenderness with palpation Adjacent facial tenderness without swelling No submandibular tenderness No anterior cervical lymphadenopathy  No anterior neck or paratracheal tenderness   Eyes: Pupils are equal, round, and reactive to light.  Neck: Normal range of motion.  Cardiovascular: Normal rate.   Pulmonary/Chest: Effort normal.  Neurological: She is alert and oriented to person, place, and time.  Skin: Skin is warm and dry. No erythema.   No edema or erythema of the face of neck No skin changes    ED Course  Procedures (including critical care time)\ DIAGNOSTIC STUDIES: Oxygen Saturation is 100% on room air, normal by my interpretation.    COORDINATION OF CARE:  12:29 PM Discussed course of care with pt which includes antibiotics and pain medication. Pt understands and agrees.    Labs Review Labs Reviewed - No data to display  Imaging Review No results found.   EKG Interpretation None      MDM   Final diagnoses:  Pain, dental    Afebrile, nontoxic patient with new dental pain.  No obvious abscess.  No concerning findings on exam.  Doubt deep space head or neck infection.  Doubt Ludwig's angina.  D/C home with antibiotic, pain medication and dental follow up.  Discussed findings, treatment, and follow up  with patient.  Pt given return precautions.  Pt verbalizes understanding and agrees with plan.        I personally performed the services described in this documentation, which was scribed in my presence. The  recorded information has been reviewed and is accurate.     Trixie Dredgemily Alfredo Spong, PA-C 03/02/14 1312

## 2014-03-02 NOTE — ED Notes (Signed)
4 day hx of l/lower jaw pain and throbbing

## 2014-03-02 NOTE — ED Provider Notes (Signed)
Medical screening examination/treatment/procedure(s) were performed by non-physician practitioner and as supervising physician I was immediately available for consultation/collaboration.   EKG Interpretation None        Enid SkeensJoshua M Jaysion Ramseyer, MD 03/02/14 1550

## 2014-03-25 ENCOUNTER — Other Ambulatory Visit: Payer: Self-pay | Admitting: Internal Medicine

## 2014-04-30 ENCOUNTER — Other Ambulatory Visit: Payer: Self-pay | Admitting: Internal Medicine

## 2014-05-26 ENCOUNTER — Other Ambulatory Visit: Payer: Self-pay | Admitting: Internal Medicine

## 2014-09-18 ENCOUNTER — Encounter (HOSPITAL_COMMUNITY): Payer: Self-pay | Admitting: Emergency Medicine

## 2014-11-29 ENCOUNTER — Encounter (HOSPITAL_COMMUNITY): Payer: Self-pay

## 2014-11-29 ENCOUNTER — Emergency Department (HOSPITAL_COMMUNITY)
Admission: EM | Admit: 2014-11-29 | Discharge: 2014-11-29 | Disposition: A | Discharge status: Home / Self Care | Payer: Medicaid Other | Attending: Emergency Medicine | Admitting: Emergency Medicine

## 2014-11-29 ENCOUNTER — Emergency Department (HOSPITAL_COMMUNITY): Payer: Medicaid Other

## 2014-11-29 DIAGNOSIS — F909 Attention-deficit hyperactivity disorder, unspecified type: Secondary | ICD-10-CM | POA: Diagnosis not present

## 2014-11-29 DIAGNOSIS — S92501A Displaced unspecified fracture of right lesser toe(s), initial encounter for closed fracture: Secondary | ICD-10-CM

## 2014-11-29 DIAGNOSIS — Y9289 Other specified places as the place of occurrence of the external cause: Secondary | ICD-10-CM | POA: Diagnosis not present

## 2014-11-29 DIAGNOSIS — S91114A Laceration without foreign body of right lesser toe(s) without damage to nail, initial encounter: Secondary | ICD-10-CM | POA: Diagnosis present

## 2014-11-29 DIAGNOSIS — Y9389 Activity, other specified: Secondary | ICD-10-CM | POA: Diagnosis not present

## 2014-11-29 DIAGNOSIS — I10 Essential (primary) hypertension: Secondary | ICD-10-CM | POA: Diagnosis not present

## 2014-11-29 DIAGNOSIS — S92521A Displaced fracture of medial phalanx of right lesser toe(s), initial encounter for closed fracture: Secondary | ICD-10-CM | POA: Insufficient documentation

## 2014-11-29 DIAGNOSIS — Y998 Other external cause status: Secondary | ICD-10-CM | POA: Insufficient documentation

## 2014-11-29 DIAGNOSIS — Z720 Tobacco use: Secondary | ICD-10-CM | POA: Diagnosis not present

## 2014-11-29 DIAGNOSIS — IMO0002 Reserved for concepts with insufficient information to code with codable children: Secondary | ICD-10-CM

## 2014-11-29 DIAGNOSIS — Z79899 Other long term (current) drug therapy: Secondary | ICD-10-CM | POA: Insufficient documentation

## 2014-11-29 DIAGNOSIS — W231XXA Caught, crushed, jammed, or pinched between stationary objects, initial encounter: Secondary | ICD-10-CM | POA: Insufficient documentation

## 2014-11-29 MED ORDER — HYDROCODONE-ACETAMINOPHEN 5-325 MG PO TABS: 1.0000 | ORAL_TABLET | Freq: Four times a day (QID) | ORAL | Status: DC | PRN

## 2014-11-29 MED ORDER — CEFAZOLIN SODIUM 1-5 GM-% IV SOLN
1.0000 g | Freq: Once | INTRAVENOUS | Status: AC
  Administered 2014-11-29: 1 g via INTRAVENOUS
  Filled 2014-11-29: qty 50

## 2014-11-29 MED ORDER — HYDROCODONE-ACETAMINOPHEN 5-325 MG PO TABS
2.0000 | ORAL_TABLET | Freq: Once | ORAL | Status: AC
  Administered 2014-11-29: 2 via ORAL
  Filled 2014-11-29: qty 2

## 2014-11-29 NOTE — ED Notes (Signed)
Pt presents with c/o right pinky toe laceration. Pt reports that her toe got caught on a shoe last night and when she unwrapped the bandage today, the toe was still bleeding. Ambulatory to triage.

## 2014-11-29 NOTE — Discharge Instructions (Signed)
Follow-up with orthopedics on Friday. Return to the ER sooner with any severe pain or swelling, purulent discharge, high fever.  Toe Fracture Your caregiver has diagnosed you as having a fractured toe. A toe fracture is a break in the bone of a toe. "Buddy taping" is a way of splinting your broken toe, by taping the broken toe to the toe next to it. This "buddy taping" will keep the injured toe from moving beyond normal range of motion. Buddy taping also helps the toe heal in a more normal alignment. It may take 6 to 8 weeks for the toe injury to heal. HOME CARE INSTRUCTIONS   Leave your toes taped together for as long as directed by your caregiver or until you see a doctor for a follow-up examination. You can change the tape after bathing. Always use a small piece of gauze or cotton between the toes when taping them together. This will help the skin stay dry and prevent infection.  Apply ice to the injury for 15-20 minutes each hour while awake for the first 2 days. Put the ice in a plastic bag and place a towel between the bag of ice and your skin.  After the first 2 days, apply heat to the injured area. Use heat for the next 2 to 3 days. Place a heating pad on the foot or soak the foot in warm water as directed by your caregiver.  Keep your foot elevated as much as possible to lessen swelling.  Wear sturdy, supportive shoes. The shoes should not pinch the toes or fit tightly against the toes.  Your caregiver may prescribe a rigid shoe if your foot is very swollen.  Your may be given crutches if the pain is too great and it hurts too much to walk.  Only take over-the-counter or prescription medicines for pain, discomfort, or fever as directed by your caregiver.  If your caregiver has given you a follow-up appointment, it is very important to keep that appointment. Not keeping the appointment could result in a chronic or permanent injury, pain, and disability. If there is any problem keeping  the appointment, you must call back to this facility for assistance. SEEK MEDICAL CARE IF:   You have increased pain or swelling, not relieved with medications.  The pain does not get better after 1 week.  Your injured toe is cold when the others are warm. SEEK IMMEDIATE MEDICAL CARE IF:   The toe becomes cold, numb, or white.  The toe becomes hot (inflamed) and red. Document Released: 10/31/2000 Document Revised: 01/26/2012 Document Reviewed: 06/19/2008 [facility] Patient Information 2015 [place], [place]. This information is not intended to replace advice given to you by your health care provider. Make sure you discuss any questions you have with your health care provider.  Non-Sutured Laceration A laceration is a cut or wound that goes through all layers of the skin and into the tissue just beneath the skin. Usually, these are stitched up or held together with tape or glue shortly after the injury occurred. However, if several or more hours have passed before getting care, too many germs (bacteria) get into the laceration. Stitching it closed would bring the risk of infection. If your health care provider feels your laceration is too old, it may be left open and then bandaged to allow healing from the bottom layer up. HOME CARE INSTRUCTIONS   Change the bandage (dressing) 2 times a day or as directed by your health care provider.  If the  dressing or packing gauze sticks, soak it off with soapy water.  When you re-bandage your laceration, make sure that the dressing or packing gauze goes all the way to the bottom of the laceration. The top of the laceration is kept open so it can heal from the bottom up. There is less chance for infection with this method.  Wash the area with soap and water 2 times a day to remove all the creams or ointments, if used. Rinse off the soap. Pat the area dry with a clean towel. Look for signs of infection, such as redness, swelling, or a red line that goes away  from the laceration.  Re-apply creams or ointments if they were used to bandage the laceration. This helps keep the bandage from sticking.  If the bandage becomes wet, dirty, or has a bad smell, change it as soon as possible.  Only take medicine as directed by your health care provider. You might need a tetanus shot now if:  You have no idea when you had the last one.  You have never had a tetanus shot before.  Your laceration had dirt in it.  Your laceration was dirty, and your last tetanus shot was more than 7 years ago.  Your laceration was clean, and your last tetanus shot was more than 10 years ago. If you need a tetanus shot, and you decide not to get one, there is a rare chance of getting tetanus. Sickness from tetanus can be serious. If you got a tetanus shot, your arm may swell and get red and warm to the touch at the shot site. This is common and not a problem. SEEK MEDICAL CARE IF:   You have redness, swelling, or increasing pain in the laceration.  You notice a red line that goes away from your laceration.  You have pus coming from the laceration.  You have a fever.  You notice a bad smell coming from the laceration or dressing.  You notice something coming out of the laceration, such as wood or glass.  Your laceration is on your hand or foot and you are unable to properly move a finger or toe.  You have severe swelling around the laceration, causing pain and numbness.  You notice a change in color in your arm, hand, leg, or foot. MAKE SURE YOU:   Understand these instructions.  Will watch your condition.  Will get help right away if you are not doing well or get worse. Document Released: 10/01/2006 Document Revised: 11/08/2013 Document Reviewed: 04/23/2009 [facility] Patient Information 2015 [place]. This information is not intended to replace advice given to you by your health care provider. Make sure you discuss any questions you have with your  health care provider.

## 2014-11-29 NOTE — ED Provider Notes (Signed)
CSN: [id number]     Arrival date & time 11/29/14  1239 History  This chart was scribed for non-physician practitioner Jinny Sanders, PA-C, working with Samuel Jester, DO by Littie Deeds, ED Scribe. This patient was seen in room WTR8/WTR8 and the patient's care was started at 1:26 PM.    Chief Complaint  Patient presents with  . Extremity Laceration   The history is provided by the patient. No language interpreter was used.   HPI Comments: [name] is a 34 y.o. female who presents to the Emergency Department complaining of a laceration to her right 5th toe that occurred last night when her toe got caught in a shoe. She states the she did not hear a cracking sound. Patient denies numbness, weakness, tingling or loss of sensation or function. Patient did not drive here today; she was brought by her father. NKDA.  Past Medical History  Diagnosis Date  . Hypertension   . Anxiety   . S/P cesarean section 01/25/2012  . ADD (attention deficit disorder)   . Drug abuse in remission    Past Surgical History  Procedure Laterality Date  . No past surgeries    . Cesarean section  01/23/2012    Procedure: CESAREAN SECTION;  Surgeon: Lavina Hamman, MD;  Location: [facility];  Service: Gynecology;  Laterality: N/A;  . Orif ankle fracture Right 01/16/2013    Procedure: OPEN REDUCTION INTERNAL FIXATION (ORIF) ANKLE FRACTURE;  Surgeon: Verlee Rossetti, MD;  Location: [facility];  Service: Orthopedics;  Laterality: Right;   Family History  Problem Relation Age of Onset  . Hypertension Father    History  Substance Use Topics  . Smoking status: Current Every Day Smoker -- 1.00 packs/day for 16 years    Types: Cigarettes  . Smokeless tobacco: Never Used  . Alcohol Use: Yes     Comment: occassional wine during pregnancy   OB History    Gravida Para Term Preterm AB TAB SAB Ectopic Multiple Living   0 0 0 0 0 0 1     Review of Systems  Musculoskeletal: Positive for arthralgias.  Skin: Positive for  wound.  Neurological: Negative for weakness and numbness.      Allergies  Celexa and Wellbutrin  Home Medications   Prior to Admission medications   Medication Sig Start Date End Date Taking? Authorizing Provider  acetaminophen-codeine (TYLENOL #3) 300-30 MG per tablet Take 1-2 tablets by mouth every 6 (six) hours as needed for moderate pain. 03/02/14   Trixie Dredge, PA-C  bisoprolol (ZEBETA) 10 MG tablet Take 10 mg by mouth daily.    Historical Provider, MD  HYDROcodone-acetaminophen (NORCO/VICODIN) 5-325 MG per tablet Take 1-2 tablets by mouth every 6 (six) hours as needed for moderate pain or severe pain. 11/29/14   Monte Fantasia, PA-C  ibuprofen (ADVIL,MOTRIN) 800 MG tablet Take 800 mg by mouth every 8 (eight) hours as needed for moderate pain.    Historical Provider, MD  ibuprofen (ADVIL,MOTRIN) 800 MG tablet Take 1 tablet (800 mg total) by mouth every 8 (eight) hours as needed. 03/02/14   Trixie Dredge, PA-C  levothyroxine (SYNTHROID, LEVOTHROID) 50 MCG tablet Take 1 tablet (50 mcg total) by mouth daily before breakfast. 11/06/13   Quentin Mulling, PA-C  lisdexamfetamine (VYVANSE) 40 MG capsule Take 40 mg by mouth every morning.    Historical Provider, MD  penicillin v potassium (VEETID) 500 MG tablet Take 1 tablet (500 mg total) by mouth 4 (four) times daily.  03/02/14   Trixie DredgeEmily West, PA-C   BP 150/100 mmHg  Pulse 85  Temp(Src) 97.5 F (36.4 C) (Oral)  Resp 18  SpO2 99%  LMP 11/24/2014 Physical Exam  Constitutional: She is oriented to person, place, and time. She appears well-developed and well-nourished. No distress.  HENT:  Head: Normocephalic and atraumatic.  Mouth/Throat: Oropharynx is clear and moist. No oropharyngeal exudate.  Eyes: Pupils are equal, round, and reactive to light.  Neck: Neck supple.  Cardiovascular: Normal rate.   Pulses:      Dorsalis pedis pulses are 2+ on the right side, and 2+ on the left side.  Capillary refill < 2 seconds.  Pulmonary/Chest: Effort  normal.  Musculoskeletal: She exhibits no edema.  Neurological: She is alert and oriented to person, place, and time. No cranial nerve deficit.  Distal sensation intact.  Skin: Skin is warm and dry. No rash noted.  2 cm laceration extending from the web space of her 4th and 5th toes extending to the plantar surface.  Psychiatric: She has a normal mood and affect. Her behavior is normal.  Nursing note and vitals reviewed.   ED Course  Procedures  DIAGNOSTIC STUDIES: Oxygen Saturation is 99% on room air, normal by my interpretation.    COORDINATION OF CARE: 1:31 PM-Discussed treatment plan which includes pain medication with pt at bedside and pt agreed to plan.   Labs Review Labs Reviewed - No data to display  Imaging Review Dg Foot Complete Right  11/29/2014   CLINICAL DATA:  Injured fifth toe last evening.  EXAM: RIGHT FOOT COMPLETE - 3+ VIEW  COMPARISON:  04/07/2009  FINDINGS: The joint spaces are maintained. Suspect a subtle cortical fracture involving the middle phalanx of the fifth toe. No other bony abnormalities.  IMPRESSION: Possible subtle fracture involving the middle phalanx of the fifth toe.   Electronically Signed   By: Loralie ChampagneMark  Gallerani M.D.   On: 11/29/2014 14:00     EKG Interpretation None      MDM   Final diagnoses:  Laceration  Closed fracture of fifth toe of right foot, initial encounter   Patient with small laceration of her pinky toe which was caught last night on a shoe. Laceration is between webspace of small toe and fourth toe. Laceration debrided and cleaned, bottom of wound easily visualized in the subcutaneous tissue. No tendon or nerve involvement. Patient neurovascularly intact. Radiographs remarkable for a possible subtle fracture involving the middle families of the fifth toe. This fracture is consistent with where the laceration is. Based on the fact the patient's laceration is on the bottom of her foot, and have been greater than 12 hours ago, we  will not attempt to repair today. Orthopedics consult placed due to the laceration overlying the fracture which is not obviously extend into the MCP joint or tendon fascia. I spoke with Dr. Sherlean FootLucey with orthopedics who recommended placing patient on 1 g of IV Ancef in the ER, buddy taping her toes together, placing patient in postop shoe, and having her follow-up in his office in 2 days on Friday, 12/01/14. Patient given strict return precautions and educated on proper wound care. Patient sent home with pain medicine, and strongly encouraged to follow-up with orthopedics on Friday. Patient verbalizes understanding and agreement of this plan, I encouraged patient to call or return to the ER should she have any questions or concerns.  I personally performed the services described in this documentation, which was scribed in my presence. The recorded information has  been reviewed and is accurate.  BP 150/100 mmHg  Pulse 85  Temp(Src) 97.5 F (36.4 C) (Oral)  Resp 18  SpO2 99%  LMP 11/24/2014  Signed,  Ladona Mow, PA-C 10:31 PM  Patient discussed with Dr. Samuel Jester, M.D.   Monte Fantasia, PA-C 11/29/14 2231  Samuel Jester, DO 12/02/14 [phone]

## 2016-03-27 ENCOUNTER — Emergency Department (HOSPITAL_COMMUNITY)
Admission: EM | Admit: 2016-03-27 | Discharge: 2016-03-27 | Disposition: A | Discharge status: Home / Self Care | Payer: Medicaid Other | Attending: Emergency Medicine | Admitting: Emergency Medicine

## 2016-03-27 ENCOUNTER — Encounter (HOSPITAL_COMMUNITY): Payer: Self-pay | Admitting: *Deleted

## 2016-03-27 DIAGNOSIS — H6692 Otitis media, unspecified, left ear: Secondary | ICD-10-CM | POA: Diagnosis not present

## 2016-03-27 DIAGNOSIS — H9202 Otalgia, left ear: Secondary | ICD-10-CM | POA: Diagnosis present

## 2016-03-27 DIAGNOSIS — I10 Essential (primary) hypertension: Secondary | ICD-10-CM | POA: Diagnosis not present

## 2016-03-27 DIAGNOSIS — Z792 Long term (current) use of antibiotics: Secondary | ICD-10-CM | POA: Insufficient documentation

## 2016-03-27 DIAGNOSIS — Z791 Long term (current) use of non-steroidal anti-inflammatories (NSAID): Secondary | ICD-10-CM | POA: Diagnosis not present

## 2016-03-27 DIAGNOSIS — H6092 Unspecified otitis externa, left ear: Secondary | ICD-10-CM | POA: Diagnosis not present

## 2016-03-27 DIAGNOSIS — Z79891 Long term (current) use of opiate analgesic: Secondary | ICD-10-CM | POA: Insufficient documentation

## 2016-03-27 DIAGNOSIS — Z79899 Other long term (current) drug therapy: Secondary | ICD-10-CM | POA: Diagnosis not present

## 2016-03-27 DIAGNOSIS — F909 Attention-deficit hyperactivity disorder, unspecified type: Secondary | ICD-10-CM | POA: Diagnosis not present

## 2016-03-27 DIAGNOSIS — F17210 Nicotine dependence, cigarettes, uncomplicated: Secondary | ICD-10-CM | POA: Diagnosis not present

## 2016-03-27 MED ORDER — AMOXICILLIN 500 MG PO CAPS: 500.0000 mg | ORAL_CAPSULE | Freq: Two times a day (BID) | ORAL | Status: DC

## 2016-03-27 MED ORDER — IBUPROFEN 800 MG PO TABS
800.0000 mg | ORAL_TABLET | Freq: Once | ORAL | Status: AC
  Administered 2016-03-27: 800 mg via ORAL
  Filled 2016-03-27: qty 1

## 2016-03-27 MED ORDER — OFLOXACIN 0.3 % OT SOLN: 5.0000 [drp] | Freq: Two times a day (BID) | OTIC | Status: DC

## 2016-03-27 MED ORDER — IBUPROFEN 800 MG PO TABS: 800.0000 mg | ORAL_TABLET | Freq: Three times a day (TID) | ORAL | Status: DC

## 2016-03-27 MED ORDER — DEXAMETHASONE SODIUM PHOSPHATE 10 MG/ML IJ SOLN
10.0000 mg | Freq: Once | INTRAMUSCULAR | Status: AC
  Administered 2016-03-27: 10 mg via INTRAMUSCULAR
  Filled 2016-03-27: qty 1

## 2016-03-27 NOTE — Discharge Instructions (Signed)
Otitis Externa  Otitis externa is a bacterial or fungal infection of the outer ear canal. This is the area from the eardrum to the outside of the ear. Otitis externa is sometimes called "swimmer's ear."  CAUSES   Possible causes of infection include:   Swimming in dirty water.   Moisture remaining in the ear after swimming or bathing.   Mild injury (trauma) to the ear.   Objects stuck in the ear (foreign body).   Cuts or scrapes (abrasions) on the outside of the ear.  SIGNS AND SYMPTOMS   The first symptom of infection is often itching in the ear canal. Later signs and symptoms may include swelling and redness of the ear canal, ear pain, and yellowish-white fluid (pus) coming from the ear. The ear pain may be worse when pulling on the earlobe.  DIAGNOSIS   Your health care provider will perform a physical exam. A sample of fluid may be taken from the ear and examined for bacteria or fungi.  TREATMENT   Antibiotic ear drops are often given for 10 to 14 days. Treatment may also include pain medicine or corticosteroids to reduce itching and swelling.  HOME CARE INSTRUCTIONS    Apply antibiotic ear drops to the ear canal as prescribed by your health care provider.   Take medicines only as directed by your health care provider.   If you have diabetes, follow any additional treatment instructions from your health care provider.   Keep all follow-up visits as directed by your health care provider.  PREVENTION    Keep your ear dry. Use the corner of a towel to absorb water out of the ear canal after swimming or bathing.   Avoid scratching or putting objects inside your ear. This can damage the ear canal or remove the protective wax that lines the canal. This makes it easier for bacteria and fungi to grow.   Avoid swimming in lakes, polluted water, or poorly chlorinated pools.   You may use ear drops made of rubbing alcohol and vinegar after swimming. Combine equal parts of white vinegar and alcohol in a bottle.  Put 3 or 4 drops into each ear after swimming.  SEEK MEDICAL CARE IF:    You have a fever.   Your ear is still red, swollen, painful, or draining pus after 3 days.   Your redness, swelling, or pain gets worse.   You have a severe headache.   You have redness, swelling, pain, or tenderness in the area behind your ear.  MAKE SURE YOU:    Understand these instructions.   Will watch your condition.   Will get help right away if you are not doing well or get worse.     This information is not intended to replace advice given to you by your health care provider. Make sure you discuss any questions you have with your health care provider.     Document Released: 11/03/2005 Document Revised: 11/24/2014 Document Reviewed: 11/20/2011  Elsevier Interactive Patient Education 2016 Elsevier Inc.      Otitis Media, Adult  Otitis media is redness, soreness, and inflammation of the middle ear. Otitis media may be caused by allergies or, most commonly, by infection. Often it occurs as a complication of the common cold.  SIGNS AND SYMPTOMS  Symptoms of otitis media may include:   Earache.   Fever.   Ringing in your ear.   Headache.   Leakage of fluid from the ear.  DIAGNOSIS  To diagnose otitis   media, your health care provider will examine your ear with an otoscope. This is an instrument that allows your health care provider to see into your ear in order to examine your eardrum. Your health care provider also will ask you questions about your symptoms.  TREATMENT   Typically, otitis media resolves on its own within 3-5 days. Your health care provider may prescribe medicine to ease your symptoms of pain. If otitis media does not resolve within 5 days or is recurrent, your health care provider may prescribe antibiotic medicines if he or she suspects that a bacterial infection is the cause.  HOME CARE INSTRUCTIONS    If you were prescribed an antibiotic medicine, finish it all even if you start to feel better.   Take  medicines only as directed by your health care provider.   Keep all follow-up visits as directed by your health care provider.  SEEK MEDICAL CARE IF:   You have otitis media only in one ear, or bleeding from your nose, or both.   You notice a lump on your neck.   You are not getting better in 3-5 days.   You feel worse instead of better.  SEEK IMMEDIATE MEDICAL CARE IF:    You have pain that is not controlled with medicine.   You have swelling, redness, or pain around your ear or stiffness in your neck.   You notice that part of your face is paralyzed.   You notice that the bone behind your ear (mastoid) is tender when you touch it.  MAKE SURE YOU:    Understand these instructions.   Will watch your condition.   Will get help right away if you are not doing well or get worse.     This information is not intended to replace advice given to you by your health care provider. Make sure you discuss any questions you have with your health care provider.     Document Released: 08/08/2004 Document Revised: 11/24/2014 Document Reviewed: 05/31/2013  Elsevier Interactive Patient Education 2016 Elsevier Inc.

## 2016-03-27 NOTE — ED Notes (Signed)
Per pt report: pt began having left ear pain that radiates to her jaw.  Pt denies any injuries or being in any pools of water.  Pt has an appt with her PCP tomorrow but pain so severe pt had to be seen today.

## 2016-03-27 NOTE — ED Provider Notes (Signed)
CSN: [id number]     Arrival date & time 03/27/16  1327 History  By signing my name below, I, Placido SouLogan Joldersma, attest that this documentation has been prepared under the direction and in the presence of Rorey Bisson Y Riyan Gavina, [place]-C. Electronically Signed: Placido SouLogan Joldersma, ED Scribe. 03/27/2016. 1:48 PM.   Chief Complaint  Patient presents with  . Otalgia   The history is provided by the patient. No language interpreter was used.    HPI Comments: [name] is a 35 y.o. female who presents to the Emergency Department complaining of constant, moderate, left ear pain x 1 day. Pt describes her pain as her ear feeling as if it will pop noting that she has a slight difficulty opening her mouth due to the pain. Her pain worsens with jaw movement or palpation of the affected ear. Her pain radiates to her jaw. She has taken Tylenol for pain management with her last dosage ~3 hours ago which provided no significant relief. Pt reports associated, mild, decreased hearing in the affected ear. She has an appointment with her PCP tomorrow. She denies ear drainage, fever, chills, right ear pain or any other associated symptoms at this time.    Past Medical History  Diagnosis Date  . Hypertension   . Anxiety   . S/P cesarean section 01/25/2012  . ADD (attention deficit disorder)   . Drug abuse in remission    Past Surgical History  Procedure Laterality Date  . No past surgeries    . Cesarean section  01/23/2012    Procedure: CESAREAN SECTION;  Surgeon: Lavina Hammanodd Meisinger, MD;  Location: [facility];  Service: Gynecology;  Laterality: N/A;  . Orif ankle fracture Right 01/16/2013    Procedure: OPEN REDUCTION INTERNAL FIXATION (ORIF) ANKLE FRACTURE;  Surgeon: Verlee RossettiSteven R Norris, MD;  Location: [facility];  Service: Orthopedics;  Laterality: Right;   Family History  Problem Relation Age of Onset  . Hypertension Father    Social History  Substance Use Topics  . Smoking status: Current Every Day Smoker -- 1.00 packs/day for 16 years     Types: Cigarettes  . Smokeless tobacco: Never Used  . Alcohol Use: Yes     Comment: occassional wine during pregnancy   OB History    Gravida Para Term Preterm AB TAB SAB Ectopic Multiple Living   1 1 1  0 0 0 0 0 0 1     Review of Systems A complete 10 system review of systems was obtained and all systems are negative except as noted in the HPI and PMH.   Allergies  Celexa and Wellbutrin  Home Medications   Prior to Admission medications   Medication Sig Start Date End Date Taking? Authorizing Provider  acetaminophen-codeine (TYLENOL #3) 300-30 MG per tablet Take 1-2 tablets by mouth every 6 (six) hours as needed for moderate pain. 03/02/14   Trixie DredgeEmily West, PA-C  bisoprolol (ZEBETA) 10 MG tablet Take 10 mg by mouth daily.    Historical Provider, MD  HYDROcodone-acetaminophen (NORCO/VICODIN) 5-325 MG per tablet Take 1-2 tablets by mouth every 6 (six) hours as needed for moderate pain or severe pain. 11/29/14   Ladona MowJoe Mintz, PA-C  ibuprofen (ADVIL,MOTRIN) 800 MG tablet Take 800 mg by mouth every 8 (eight) hours as needed for moderate pain.    Historical Provider, MD  ibuprofen (ADVIL,MOTRIN) 800 MG tablet Take 1 tablet (800 mg total) by mouth every 8 (eight) hours as needed. 03/02/14   Trixie DredgeEmily West, PA-C  levothyroxine (SYNTHROID, LEVOTHROID) 50 MCG  tablet Take 1 tablet (50 mcg total) by mouth daily before breakfast. 11/06/13   Quentin Mulling, PA-C  lisdexamfetamine (VYVANSE) 40 MG capsule Take 40 mg by mouth every morning.    Historical Provider, MD  penicillin v potassium (VEETID) 500 MG tablet Take 1 tablet (500 mg total) by mouth 4 (four) times daily. 03/02/14   Trixie Dredge, PA-C   BP 146/99 mmHg  Pulse 114  Temp(Src) 97.9 F (36.6 C) (Oral)  Resp 19  SpO2 99%    Physical Exam  Constitutional: She is oriented to person, place, and time. She appears well-developed and well-nourished.  HENT:  Head: Normocephalic and atraumatic.  No trismus No posterior oropharyngeal edema or  PTA Right ear NL Left ear with +tragal tenderness and canal pain with pinna movement. No mastoid erythema, edema, or ttp. Ear canal erythematous and mildly edematous. Left TM with effusion and mild erythema.   Eyes: Conjunctivae and EOM are normal.  Neck: Normal range of motion. Neck supple.  Cardiovascular: Normal rate, regular rhythm and normal heart sounds.   Pulmonary/Chest: Effort normal and breath sounds normal. No respiratory distress. She has no wheezes.  Abdominal: Soft.  Musculoskeletal: Normal range of motion.  Lymphadenopathy:    She has no cervical adenopathy.  Neurological: She is alert and oriented to person, place, and time.  Skin: Skin is warm and dry.  Psychiatric: She has a normal mood and affect.  Nursing note and vitals reviewed.   ED Course  Procedures  DIAGNOSTIC STUDIES: Oxygen Saturation is 99% on RA, normal by my interpretation.    COORDINATION OF CARE: 1:48 PM Discussed next steps with pt. She verbalized understanding and is agreeable with the plan.   Labs Review Labs Reviewed - No data to display  Imaging Review No results found.   EKG Interpretation None      MDM   Final diagnoses:  Otitis externa, left  Acute left otitis media, recurrence not specified, unspecified otitis media type    Given exam will cover both otitis externa and acute otitis media. Decadron and ibuprofen given in the ED with rx for ibuprofen as well. Rx given for ofloxacin otic and amoxicillin PO. Instructed PCP f/u within one week. ER return precautions given.   I personally performed the services described in this documentation, which was scribed in my presence. The recorded information has been reviewed and is accurate.   Carlene Coria, PA-C 03/27/16 1432  Gerhard Munch, MD 03/27/16 2238

## 2016-09-25 LAB — OB RESULTS CONSOLE ABO/RH: RH TYPE: POSITIVE

## 2016-09-25 LAB — OB RESULTS CONSOLE HIV ANTIBODY (ROUTINE TESTING): HIV: NONREACTIVE

## 2016-09-25 LAB — OB RESULTS CONSOLE RPR: RPR: NONREACTIVE

## 2016-09-25 LAB — OB RESULTS CONSOLE ANTIBODY SCREEN: Antibody Screen: NEGATIVE

## 2016-09-25 LAB — OB RESULTS CONSOLE RUBELLA ANTIBODY, IGM: Rubella: IMMUNE

## 2016-09-25 LAB — OB RESULTS CONSOLE HEPATITIS B SURFACE ANTIGEN: Hepatitis B Surface Ag: NEGATIVE

## 2017-03-24 LAB — OB RESULTS CONSOLE GBS: GBS: POSITIVE

## 2017-04-10 ENCOUNTER — Inpatient Hospital Stay (HOSPITAL_COMMUNITY)
Admission: RE | Admit: 2017-04-10 | Discharge: 2017-04-12 | DRG: 765 | Disposition: A | Discharge status: Home / Self Care | Payer: Medicaid Other | Source: Ambulatory Visit | Attending: Obstetrics and Gynecology | Admitting: Obstetrics and Gynecology

## 2017-04-10 ENCOUNTER — Inpatient Hospital Stay (HOSPITAL_COMMUNITY): Payer: Medicaid Other | Admitting: Anesthesiology

## 2017-04-10 ENCOUNTER — Encounter (HOSPITAL_COMMUNITY): Payer: Self-pay | Admitting: *Deleted

## 2017-04-10 ENCOUNTER — Encounter (HOSPITAL_COMMUNITY)
Admission: RE | Disposition: A | Discharge status: Home / Self Care | Payer: Self-pay | Source: Ambulatory Visit | Attending: Obstetrics and Gynecology

## 2017-04-10 DIAGNOSIS — O1002 Pre-existing essential hypertension complicating childbirth: Secondary | ICD-10-CM | POA: Diagnosis present

## 2017-04-10 DIAGNOSIS — Z3A38 38 weeks gestation of pregnancy: Secondary | ICD-10-CM

## 2017-04-10 DIAGNOSIS — F17210 Nicotine dependence, cigarettes, uncomplicated: Secondary | ICD-10-CM | POA: Diagnosis present

## 2017-04-10 DIAGNOSIS — O34211 Maternal care for low transverse scar from previous cesarean delivery: Principal | ICD-10-CM | POA: Diagnosis present

## 2017-04-10 DIAGNOSIS — O99824 Streptococcus B carrier state complicating childbirth: Secondary | ICD-10-CM | POA: Diagnosis present

## 2017-04-10 DIAGNOSIS — O163 Unspecified maternal hypertension, third trimester: Secondary | ICD-10-CM | POA: Diagnosis present

## 2017-04-10 DIAGNOSIS — O99334 Smoking (tobacco) complicating childbirth: Secondary | ICD-10-CM | POA: Diagnosis present

## 2017-04-10 LAB — TYPE AND SCREEN
ABO/RH(D): O POS
ANTIBODY SCREEN: NEGATIVE

## 2017-04-10 LAB — CBC
HCT: 37.3 % (ref 36.0–46.0)
Hemoglobin: 12.5 g/dL (ref 12.0–15.0)
MCH: 29.5 pg (ref 26.0–34.0)
MCHC: 33.5 g/dL (ref 30.0–36.0)
MCV: 88 fL (ref 78.0–100.0)
PLATELETS: 271 10*3/uL (ref 150–400)
RBC: 4.24 MIL/uL (ref 3.87–5.11)
RDW: 14.7 % (ref 11.5–15.5)
WBC: 13 10*3/uL — ABNORMAL HIGH (ref 4.0–10.5)

## 2017-04-10 LAB — COMPREHENSIVE METABOLIC PANEL
ALT: 21 U/L (ref 14–54)
ANION GAP: 6 (ref 5–15)
AST: 22 U/L (ref 15–41)
Albumin: 3 g/dL — ABNORMAL LOW (ref 3.5–5.0)
Alkaline Phosphatase: 175 U/L — ABNORMAL HIGH (ref 38–126)
BUN: 9 mg/dL (ref 6–20)
CHLORIDE: 110 mmol/L (ref 101–111)
CO2: 21 mmol/L — AB (ref 22–32)
Calcium: 9.1 mg/dL (ref 8.9–10.3)
Creatinine, Ser: 0.45 mg/dL (ref 0.44–1.00)
Glucose, Bld: 82 mg/dL (ref 65–99)
POTASSIUM: 4.2 mmol/L (ref 3.5–5.1)
SODIUM: 137 mmol/L (ref 135–145)
Total Bilirubin: 0.3 mg/dL (ref 0.3–1.2)
Total Protein: 6.3 g/dL — ABNORMAL LOW (ref 6.5–8.1)

## 2017-04-10 SURGERY — Surgical Case
Anesthesia: Spinal | Wound class: Clean Contaminated

## 2017-04-10 MED ORDER — NALBUPHINE HCL 10 MG/ML IJ SOLN: 5.0000 mg | INTRAMUSCULAR | Status: DC | PRN

## 2017-04-10 MED ORDER — KETOROLAC TROMETHAMINE 30 MG/ML IJ SOLN
30.0000 mg | Freq: Four times a day (QID) | INTRAMUSCULAR | Status: DC | PRN
  Administered 2017-04-10: 30 mg via INTRAMUSCULAR

## 2017-04-10 MED ORDER — OXYTOCIN 10 UNIT/ML IJ SOLN
INTRAVENOUS | Status: DC | PRN
  Administered 2017-04-10: 40 [IU] via INTRAVENOUS

## 2017-04-10 MED ORDER — MENTHOL 3 MG MT LOZG: 1.0000 | LOZENGE | OROMUCOSAL | Status: DC | PRN

## 2017-04-10 MED ORDER — ONDANSETRON HCL 4 MG/2ML IJ SOLN: 4.0000 mg | Freq: Three times a day (TID) | INTRAMUSCULAR | Status: DC | PRN

## 2017-04-10 MED ORDER — MAGNESIUM HYDROXIDE 400 MG/5ML PO SUSP: 30.0000 mL | ORAL | Status: DC | PRN

## 2017-04-10 MED ORDER — ZOLPIDEM TARTRATE 5 MG PO TABS: 5.0000 mg | ORAL_TABLET | Freq: Every evening | ORAL | Status: DC | PRN

## 2017-04-10 MED ORDER — COCONUT OIL OIL: 1.0000 "application " | TOPICAL_OIL | Status: DC | PRN

## 2017-04-10 MED ORDER — ACETAMINOPHEN 325 MG PO TABS
650.0000 mg | ORAL_TABLET | ORAL | Status: DC | PRN
  Administered 2017-04-11 – 2017-04-12 (×3): 650 mg via ORAL
  Filled 2017-04-10 (×4): qty 2

## 2017-04-10 MED ORDER — SENNOSIDES-DOCUSATE SODIUM 8.6-50 MG PO TABS
2.0000 | ORAL_TABLET | ORAL | Status: DC
  Administered 2017-04-10 – 2017-04-11 (×2): 2 via ORAL
  Filled 2017-04-10 (×2): qty 2

## 2017-04-10 MED ORDER — OXYCODONE HCL 5 MG PO TABS
10.0000 mg | ORAL_TABLET | ORAL | Status: DC | PRN
  Administered 2017-04-11 – 2017-04-12 (×4): 10 mg via ORAL
  Filled 2017-04-10 (×4): qty 2

## 2017-04-10 MED ORDER — SCOPOLAMINE 1 MG/3DAYS TD PT72
MEDICATED_PATCH | TRANSDERMAL | Status: DC | PRN
  Administered 2017-04-10: 1 via TRANSDERMAL

## 2017-04-10 MED ORDER — ONDANSETRON HCL 4 MG/2ML IJ SOLN
INTRAMUSCULAR | Status: AC
  Filled 2017-04-10: qty 2

## 2017-04-10 MED ORDER — OXYCODONE HCL 5 MG PO TABS
5.0000 mg | ORAL_TABLET | ORAL | Status: DC | PRN
  Administered 2017-04-11 (×3): 5 mg via ORAL
  Filled 2017-04-10 (×3): qty 1

## 2017-04-10 MED ORDER — OXYTOCIN 40 UNITS IN LACTATED RINGERS INFUSION - SIMPLE MED: 2.5000 [IU]/h | INTRAVENOUS | Status: AC

## 2017-04-10 MED ORDER — DEXAMETHASONE SODIUM PHOSPHATE 4 MG/ML IJ SOLN
INTRAMUSCULAR | Status: AC
  Filled 2017-04-10: qty 1

## 2017-04-10 MED ORDER — FENTANYL CITRATE (PF) 100 MCG/2ML IJ SOLN
INTRAMUSCULAR | Status: AC
  Filled 2017-04-10: qty 2

## 2017-04-10 MED ORDER — OXYTOCIN 10 UNIT/ML IJ SOLN
INTRAMUSCULAR | Status: AC
  Filled 2017-04-10: qty 4

## 2017-04-10 MED ORDER — WITCH HAZEL-GLYCERIN EX PADS: 1.0000 "application " | MEDICATED_PAD | CUTANEOUS | Status: DC | PRN

## 2017-04-10 MED ORDER — ONDANSETRON HCL 4 MG/2ML IJ SOLN
INTRAMUSCULAR | Status: DC | PRN
  Administered 2017-04-10: 4 mg via INTRAVENOUS

## 2017-04-10 MED ORDER — SOD CITRATE-CITRIC ACID 500-334 MG/5ML PO SOLN
30.0000 mL | Freq: Once | ORAL | Status: AC
  Administered 2017-04-10: 30 mL via ORAL
  Filled 2017-04-10: qty 15

## 2017-04-10 MED ORDER — BUPIVACAINE IN DEXTROSE 0.75-8.25 % IT SOLN
INTRATHECAL | Status: AC
  Filled 2017-04-10: qty 2

## 2017-04-10 MED ORDER — FENTANYL CITRATE (PF) 100 MCG/2ML IJ SOLN
INTRAMUSCULAR | Status: AC
  Administered 2017-04-10: 25 ug via INTRAVENOUS
  Filled 2017-04-10: qty 2

## 2017-04-10 MED ORDER — MORPHINE SULFATE (PF) 0.5 MG/ML IJ SOLN
INTRAMUSCULAR | Status: AC
  Filled 2017-04-10: qty 10

## 2017-04-10 MED ORDER — SIMETHICONE 80 MG PO CHEW
80.0000 mg | CHEWABLE_TABLET | ORAL | Status: DC | PRN
  Administered 2017-04-10 – 2017-04-12 (×4): 80 mg via ORAL
  Filled 2017-04-10 (×4): qty 1

## 2017-04-10 MED ORDER — KETOROLAC TROMETHAMINE 30 MG/ML IJ SOLN
INTRAMUSCULAR | Status: AC
  Administered 2017-04-10: 30 mg via INTRAMUSCULAR
  Filled 2017-04-10: qty 1

## 2017-04-10 MED ORDER — MEPERIDINE HCL 25 MG/ML IJ SOLN: 6.2500 mg | INTRAMUSCULAR | Status: DC | PRN

## 2017-04-10 MED ORDER — NALBUPHINE HCL 10 MG/ML IJ SOLN: 5.0000 mg | Freq: Once | INTRAMUSCULAR | Status: DC | PRN

## 2017-04-10 MED ORDER — TETANUS-DIPHTH-ACELL PERTUSSIS 5-2.5-18.5 LF-MCG/0.5 IM SUSP
0.5000 mL | Freq: Once | INTRAMUSCULAR | Status: DC
Start: 2017-04-11 — End: 2017-04-12

## 2017-04-10 MED ORDER — IBUPROFEN 600 MG PO TABS
600.0000 mg | ORAL_TABLET | Freq: Four times a day (QID) | ORAL | Status: DC
  Administered 2017-04-10 – 2017-04-12 (×7): 600 mg via ORAL
  Filled 2017-04-10 (×7): qty 1

## 2017-04-10 MED ORDER — PROMETHAZINE HCL 25 MG/ML IJ SOLN: 6.2500 mg | INTRAMUSCULAR | Status: DC | PRN

## 2017-04-10 MED ORDER — HYDRALAZINE HCL 20 MG/ML IJ SOLN: 10.0000 mg | Freq: Once | INTRAMUSCULAR | Status: DC | PRN

## 2017-04-10 MED ORDER — DIPHENHYDRAMINE HCL 50 MG/ML IJ SOLN
INTRAMUSCULAR | Status: AC
  Filled 2017-04-10: qty 1

## 2017-04-10 MED ORDER — NICOTINE 14 MG/24HR TD PT24
14.0000 mg | MEDICATED_PATCH | Freq: Every day | TRANSDERMAL | Status: DC
  Administered 2017-04-10: 14 mg via TRANSDERMAL
  Filled 2017-04-10 (×3): qty 1

## 2017-04-10 MED ORDER — KETOROLAC TROMETHAMINE 30 MG/ML IJ SOLN: 30.0000 mg | Freq: Once | INTRAMUSCULAR | Status: DC | PRN

## 2017-04-10 MED ORDER — FENTANYL CITRATE (PF) 100 MCG/2ML IJ SOLN
INTRAMUSCULAR | Status: DC | PRN
  Administered 2017-04-10: 20 ug via INTRATHECAL

## 2017-04-10 MED ORDER — DEXAMETHASONE SODIUM PHOSPHATE 4 MG/ML IJ SOLN
INTRAMUSCULAR | Status: DC | PRN
  Administered 2017-04-10: 4 mg via INTRAVENOUS

## 2017-04-10 MED ORDER — DIPHENHYDRAMINE HCL 25 MG PO CAPS: 25.0000 mg | ORAL_CAPSULE | ORAL | Status: DC | PRN

## 2017-04-10 MED ORDER — FENTANYL CITRATE (PF) 100 MCG/2ML IJ SOLN
25.0000 ug | INTRAMUSCULAR | Status: DC | PRN
  Administered 2017-04-10 (×2): 25 ug via INTRAVENOUS

## 2017-04-10 MED ORDER — SCOPOLAMINE 1 MG/3DAYS TD PT72: 1.0000 | MEDICATED_PATCH | Freq: Once | TRANSDERMAL | Status: DC

## 2017-04-10 MED ORDER — PHENYLEPHRINE 8 MG IN D5W 100 ML (0.08MG/ML) PREMIX OPTIME
INJECTION | INTRAVENOUS | Status: DC | PRN
  Administered 2017-04-10: 60 ug/min via INTRAVENOUS

## 2017-04-10 MED ORDER — LACTATED RINGERS IV SOLN
INTRAVENOUS | Status: DC
  Administered 2017-04-10: 20:00:00 via INTRAVENOUS

## 2017-04-10 MED ORDER — KETOROLAC TROMETHAMINE 30 MG/ML IJ SOLN: 30.0000 mg | Freq: Four times a day (QID) | INTRAMUSCULAR | Status: DC | PRN

## 2017-04-10 MED ORDER — MEASLES, MUMPS & RUBELLA VAC ~~LOC~~ INJ: 0.5000 mL | INJECTION | Freq: Once | SUBCUTANEOUS | Status: DC

## 2017-04-10 MED ORDER — IBUPROFEN 600 MG PO TABS: 600.0000 mg | ORAL_TABLET | Freq: Four times a day (QID) | ORAL | Status: DC | PRN

## 2017-04-10 MED ORDER — SODIUM CHLORIDE 0.9% FLUSH: 3.0000 mL | INTRAVENOUS | Status: DC | PRN

## 2017-04-10 MED ORDER — NALOXONE HCL 2 MG/2ML IJ SOSY: 1.0000 ug/kg/h | PREFILLED_SYRINGE | INTRAVENOUS | Status: DC | PRN

## 2017-04-10 MED ORDER — BUPIVACAINE IN DEXTROSE 0.75-8.25 % IT SOLN
INTRATHECAL | Status: DC | PRN
  Administered 2017-04-10: 1.6 mL via INTRATHECAL

## 2017-04-10 MED ORDER — LACTATED RINGERS IV SOLN
INTRAVENOUS | Status: DC
  Administered 2017-04-10 (×3): via INTRAVENOUS

## 2017-04-10 MED ORDER — ACETAMINOPHEN 500 MG PO TABS
1000.0000 mg | ORAL_TABLET | Freq: Four times a day (QID) | ORAL | Status: AC
  Administered 2017-04-10 – 2017-04-11 (×3): 1000 mg via ORAL
  Filled 2017-04-10 (×3): qty 2

## 2017-04-10 MED ORDER — DIPHENHYDRAMINE HCL 50 MG/ML IJ SOLN: 12.5000 mg | INTRAMUSCULAR | Status: DC | PRN

## 2017-04-10 MED ORDER — EPHEDRINE SULFATE 50 MG/ML IJ SOLN
INTRAMUSCULAR | Status: DC | PRN
  Administered 2017-04-10: 5 mg via INTRAVENOUS

## 2017-04-10 MED ORDER — PRENATAL MULTIVITAMIN CH
1.0000 | ORAL_TABLET | Freq: Every day | ORAL | Status: DC
  Administered 2017-04-11: 1 via ORAL
  Filled 2017-04-10: qty 1

## 2017-04-10 MED ORDER — DIPHENHYDRAMINE HCL 25 MG PO CAPS: 25.0000 mg | ORAL_CAPSULE | Freq: Four times a day (QID) | ORAL | Status: DC | PRN

## 2017-04-10 MED ORDER — CEFAZOLIN SODIUM-DEXTROSE 2-4 GM/100ML-% IV SOLN
2.0000 g | INTRAVENOUS | Status: AC
  Administered 2017-04-10: 2 g via INTRAVENOUS
  Filled 2017-04-10: qty 100

## 2017-04-10 MED ORDER — PHENYLEPHRINE HCL 10 MG/ML IJ SOLN
INTRAMUSCULAR | Status: DC | PRN
  Administered 2017-04-10 (×4): 80 ug via INTRAVENOUS
  Administered 2017-04-10: 880 ug via INTRAVENOUS

## 2017-04-10 MED ORDER — MORPHINE SULFATE (PF) 0.5 MG/ML IJ SOLN
INTRAMUSCULAR | Status: DC | PRN
  Administered 2017-04-10: .2 mg via INTRATHECAL

## 2017-04-10 MED ORDER — DIBUCAINE 1 % RE OINT: 1.0000 "application " | TOPICAL_OINTMENT | RECTAL | Status: DC | PRN

## 2017-04-10 MED ORDER — NALOXONE HCL 0.4 MG/ML IJ SOLN: 0.4000 mg | INTRAMUSCULAR | Status: DC | PRN

## 2017-04-10 MED ORDER — PHENYLEPHRINE 8 MG IN D5W 100 ML (0.08MG/ML) PREMIX OPTIME
INJECTION | INTRAVENOUS | Status: AC
  Filled 2017-04-10: qty 100

## 2017-04-10 MED ORDER — SODIUM CHLORIDE 0.9 % IR SOLN
Status: DC | PRN
  Administered 2017-04-10: 1

## 2017-04-10 MED ORDER — LABETALOL HCL 5 MG/ML IV SOLN: 20.0000 mg | INTRAVENOUS | Status: DC | PRN

## 2017-04-10 SURGICAL SUPPLY — 40 items
APL SKNCLS STERI-STRIP NONHPOA (GAUZE/BANDAGES/DRESSINGS) ×1
BENZOIN TINCTURE PRP APPL 2/3 (GAUZE/BANDAGES/DRESSINGS) ×2 IMPLANT
CHLORAPREP W/TINT 26ML (MISCELLANEOUS) ×3 IMPLANT
CLAMP CORD UMBIL (MISCELLANEOUS) IMPLANT
CLOSURE STERI STRIP 1/2 X4 (GAUZE/BANDAGES/DRESSINGS) ×3 IMPLANT
CLOTH BEACON ORANGE TIMEOUT ST (SAFETY) ×3 IMPLANT
CONTAINER PREFILL 10% NBF 15ML (MISCELLANEOUS) IMPLANT
DRAPE SHEET LG 3/4 BI-LAMINATE (DRAPES) ×3 IMPLANT
DRSG OPSITE POSTOP 4X10 (GAUZE/BANDAGES/DRESSINGS) ×3 IMPLANT
ELECT REM PT RETURN 9FT ADLT (ELECTROSURGICAL) ×3
ELECTRODE REM PT RTRN 9FT ADLT (ELECTROSURGICAL) ×1 IMPLANT
EXTRACTOR VACUUM KIWI (MISCELLANEOUS) ×2 IMPLANT
EXTRACTOR VACUUM M CUP 4 TUBE (SUCTIONS) IMPLANT
EXTRACTOR VACUUM M CUP 4' TUBE (SUCTIONS)
GLOVE BIOGEL PI IND STRL 7.0 (GLOVE) ×1 IMPLANT
GLOVE BIOGEL PI INDICATOR 7.0 (GLOVE) ×2
GLOVE ORTHO TXT STRL SZ7.5 (GLOVE) ×3 IMPLANT
GOWN STRL REUS W/TWL LRG LVL3 (GOWN DISPOSABLE) ×6 IMPLANT
KIT ABG SYR 3ML LUER SLIP (SYRINGE) IMPLANT
NDL HYPO 25X5/8 SAFETYGLIDE (NEEDLE) ×1 IMPLANT
NEEDLE HYPO 25X5/8 SAFETYGLIDE (NEEDLE) ×3 IMPLANT
NS IRRIG 1000ML POUR BTL (IV SOLUTION) ×3 IMPLANT
PACK C SECTION WH (CUSTOM PROCEDURE TRAY) ×3 IMPLANT
PAD OB MATERNITY 4.3X12.25 (PERSONAL CARE ITEMS) ×3 IMPLANT
PENCIL SMOKE EVAC W/HOLSTER (ELECTROSURGICAL) ×3 IMPLANT
RETAINER VISCERAL (MISCELLANEOUS) ×2 IMPLANT
RTRCTR C-SECT PINK 25CM LRG (MISCELLANEOUS) ×3 IMPLANT
SUT CHROMIC 1 CTX 36 (SUTURE) ×6 IMPLANT
SUT PLAIN 0 NONE (SUTURE) IMPLANT
SUT PLAIN 2 0 (SUTURE) ×3
SUT PLAIN 2 0 XLH (SUTURE) IMPLANT
SUT PLAIN ABS 2-0 CT1 27XMFL (SUTURE) IMPLANT
SUT VIC AB 0 CT1 27 (SUTURE) ×9
SUT VIC AB 0 CT1 27XBRD ANBCTR (SUTURE) ×2 IMPLANT
SUT VIC AB 2-0 CT1 (SUTURE) ×3 IMPLANT
SUT VIC AB 2-0 CT1 27 (SUTURE) ×3
SUT VIC AB 2-0 CT1 TAPERPNT 27 (SUTURE) ×1 IMPLANT
SUT VIC AB 4-0 KS 27 (SUTURE) IMPLANT
TOWEL OR 17X24 6PK STRL BLUE (TOWEL DISPOSABLE) ×3 IMPLANT
TRAY FOLEY BAG SILVER LF 14FR (SET/KITS/TRAYS/PACK) ×3 IMPLANT

## 2017-04-10 NOTE — H&P (Signed)
[name] is a 36 y.o. female, G2 P1001, EGA [redacted] weeks with EDC 6-8 presenting for repeat c-section.  Previous c-section, declines VBAC, initially scheduled for repeat c-section on June 4.  She has CHTN, has done well off medication until yesterday with BP 150-170/90-100 in the office, no symptoms, no proteinuria.  AMA with low risk Panorama.  OB History    Gravida Para Term Preterm AB Living   2 1 1  0 0 1   SAB TAB Ectopic Multiple Live Births   0 0 0 0 1     Past Medical History:  Diagnosis Date  . ADD (attention deficit disorder)   . Anxiety   . Drug abuse in remission   . Hypertension   . S/P cesarean section 01/25/2012   Past Surgical History:  Procedure Laterality Date  . CESAREAN SECTION  01/23/2012   Procedure: CESAREAN SECTION;  Surgeon: Lavina Hammanodd Ata Pecha, MD;  Location: [facility];  Service: Gynecology;  Laterality: N/A;  . NO PAST SURGERIES    . ORIF ANKLE FRACTURE Right 01/16/2013   Procedure: OPEN REDUCTION INTERNAL FIXATION (ORIF) ANKLE FRACTURE;  Surgeon: Verlee RossettiSteven R Norris, MD;  Location: [facility];  Service: Orthopedics;  Laterality: Right;   Family History: family history includes Hypertension in her father. Social History:  reports that she has been smoking Cigarettes.  She has a 16.00 pack-year smoking history. She has never used smokeless tobacco. She reports that she drinks alcohol. She reports that she uses drugs, including Marijuana, about 1 time per week.     Maternal Diabetes: No Genetic Screening: Normal Maternal Ultrasounds/Referrals: Normal Fetal Ultrasounds or other Referrals:  None Maternal Substance Abuse:  No Significant Maternal Medications:  None Significant Maternal Lab Results:  Lab values include: Group B Strep positive Other Comments:  Had fetal renal pyelectasis which resolved, normal Panorama for AMA  Review of Systems  Respiratory: Negative.   Cardiovascular: Negative.    Maternal Medical History:  Fetal activity: Perceived fetal activity is  normal.    Prenatal complications: PIH.   Prenatal Complications - Diabetes: none.      Blood pressure (!) 152/86, pulse 85, temperature 98.5 F (36.9 C), temperature source Oral, height 5\' 8"  (1.727 m), weight 99.8 kg (220 lb). Maternal Exam:  Abdomen: Patient reports no abdominal tenderness. Surgical scars: low transverse.   Estimated fetal weight is 7 lbs.   Fetal presentation: vertex  Introitus: Normal vulva. Normal vagina.    Fetal Exam Fetal Monitor Review: Mode: ultrasound.   Baseline rate: 140.  Variability: moderate (6-25 bpm).   Pattern: accelerations present and no decelerations.    Fetal State Assessment: Category I - tracings are normal.     Physical Exam  Vitals reviewed. Constitutional: She appears well-developed and well-nourished.  Cardiovascular: Normal rate, regular rhythm and normal heart sounds.   No murmur heard. Respiratory: Effort normal and breath sounds normal. No respiratory distress. She has no wheezes.  GI: Soft.    Prenatal labs: ABO, Rh: --/--/O POS (05/25 16100938) Antibody: NEG (05/25 [id number]) Rubella: Immune (11/09 0000) RPR: Nonreactive (11/09 0000)  HBsAg: Negative (11/09 0000)  HIV: Non-reactive (11/09 0000)  GBS: Positive (05/08 0000)   Assessment/Plan: IUP at 38 weeks, previous c-section, AMA, CHTN with recent elevated BP.  Recommended proceed with c-section now instead of as scheduled on 6-4.  Labs normal today.  Discussed c-section procedure and risks, will proceed with c-section this pm.     Leighton Roachodd D Evann Koelzer 04/10/2017, 12:36 PM

## 2017-04-10 NOTE — Anesthesia Preprocedure Evaluation (Signed)
Anesthesia Evaluation  Patient identified by MRN, date of birth, ID band Patient awake    Reviewed: Allergy & Precautions, NPO status , Patient's Chart, lab work & pertinent test results  Airway Mallampati: I       Dental no notable dental hx.    Pulmonary Current Smoker,    Pulmonary exam normal breath sounds clear to auscultation       Cardiovascular hypertension, Normal cardiovascular exam Rhythm:Regular Rate:Normal     Neuro/Psych negative neurological ROS     GI/Hepatic negative GI ROS, Neg liver ROS,   Endo/Other  negative endocrine ROS  Renal/GU negative Renal ROS  negative genitourinary   Musculoskeletal   Abdominal (+) + obese,   Peds  Hematology negative hematology ROS (+)   Anesthesia Other Findings   Reproductive/Obstetrics (+) Pregnancy                             Anesthesia Physical Anesthesia Plan  ASA: II  Anesthesia Plan: Spinal   Post-op Pain Management:    Induction:   Airway Management Planned:   Additional Equipment:   Intra-op Plan:   Post-operative Plan:   Informed Consent: I have reviewed the patients History and Physical, chart, labs and discussed the procedure including the risks, benefits and alternatives for the proposed anesthesia with the patient or authorized representative who has indicated his/her understanding and acceptance.     Plan Discussed with: CRNA and Surgeon  Anesthesia Plan Comments:         Anesthesia Quick Evaluation

## 2017-04-10 NOTE — Anesthesia Postprocedure Evaluation (Signed)
Anesthesia Post Note  Patient: Wendy BignessBeth M Burgess  Procedure(s) Performed: Procedure(s) (LRB): REPEAT CESAREAN SECTION (N/A)  Patient location during evaluation: PACU Anesthesia Type: Spinal Level of consciousness: awake Pain management: pain level controlled Vital Signs Assessment: post-procedure vital signs reviewed and stable Respiratory status: spontaneous breathing Cardiovascular status: stable Postop Assessment: no headache, no backache, spinal receding, patient able to bend at knees and no signs of nausea or vomiting Anesthetic complications: no        Last Vitals:  Vitals:   04/10/17 1609 04/10/17 1630  BP: (!) 139/93 117/87  Pulse: 78 72  Resp: 16 19  Temp: 36.7 C 37.2 C    Last Pain:  Vitals:   04/10/17 1624  TempSrc:   PainSc: 0-No pain   Pain Goal:                 Bradley Handyside JR,JOHN Rynlee Lisbon

## 2017-04-10 NOTE — Op Note (Signed)
Preoperative diagnosis: Intrauterine pregnancy at 38 weeks, previous c-section, chronic hypertension Postoperative diagnosis: Same Procedure: Repeat low transverse cesarean section without extensions Surgeon: Lavina Hammanodd Faithlynn Deeley M.D. Assistant:  Darlina SicilianHeather Kreitmeyer, RNFA Anesthesia: Spinal  Findings: Patient had normal gravid anatomy and delivered a viable female infant with Apgars of 9 and 9 weight pending Estimated blood loss: 800 cc Specimens: Placenta sent to labor and delivery Complications: None  Procedure in detail: The patient was taken to the operating room and placed in the sitting position. Dr. Arby BarretteHatchett instilled spinal anesthesia.  She was then placed in the dorsosupine position with left tilt. Abdomen was then prepped and draped in the usual sterile fashion, and a foley catheter was inserted. The level of her anesthesia was found to be adequate. Abdomen was entered via a standard Pfannenstiel incision through her previous scar. Once the peritoneal cavity was entered the Alexis disposable self-retaining retractor was placed and good visualization was achieved. Some filmy adhesions of the bladder flap were taken down sharply.  A 4 cm transverse incision was then made in the lower uterine segment pushing the bladder inferior. Once the uterine cavity was entered the incision was extended digitally. The placenta was encountered upon entry into the uterus.  The fetal vertex was grasped and delivered through the incision atraumatically with the aid of the vacuum. Mouth and nares were suctioned. The remainder of the infant then delivered atraumatically. Cord was doubly clamped and cut and the infant handed to the awaiting pediatric team. Cord blood was obtained. The placenta delivered manually. Uterus was wiped dry with clean lap pad and all clots and debris were removed. Uterine incision was inspected and found to be free of extensions. Uterine incision was closed in 1 layer with running locking #1  Chromic. One figure 8 suture of #1 Chromic used for hemostasis.  Tubes and ovaries were inspected and found to be normal. Uterine incision was inspected and found to be hemostatic. Bleeding from serosal edges was controlled with electrocautery. The Alexis retractor was removed. Subfascial space was irrigated and made hemostatic with electrocautery. Peritoneum was closed with 2-0 Vicryl.  Fascia was closed in running fashion starting at both ends and meeting in the middle with 0 Vicryl. Subcutaneous tissue was then irrigated and made hemostatic with electrocautery, then closed with running 2-0 plain gut. Skin was closed with running 4-0 Vicryl subcuticular suture followed by steri-strips and a sterile dressing. Patient tolerated the procedure well and was taken to the recovery in stable condition. Counts were correct x2, she received Ancef 2 g IV at the beginning of the procedure and she had PAS hose on throughout the procedure.

## 2017-04-10 NOTE — Anesthesia Procedure Notes (Signed)
Spinal  Patient location during procedure: OR Start time: 04/10/2017 2:48 PM End time: 04/10/2017 2:53 PM Staffing Anesthesiologist: Leilani AbleHATCHETT, Arlys Scatena Performed: anesthesiologist  Preanesthetic Checklist Completed: patient identified, surgical consent, pre-op evaluation, timeout performed, IV checked, risks and benefits discussed and monitors and equipment checked Spinal Block Patient position: sitting Prep: site prepped and draped and DuraPrep Patient monitoring: heart rate, cardiac monitor, continuous pulse ox and blood pressure Approach: midline Location: L3-4 Injection technique: single-shot Needle Needle type: Pencan  Needle gauge: 24 G Needle length: 9 cm Needle insertion depth: 7 cm Assessment Sensory level: T4

## 2017-04-10 NOTE — Transfer of Care (Signed)
Immediate Anesthesia Transfer of Care Note  Patient: Wendy BignessBeth M Burgess  Procedure(s) Performed: Procedure(s) with comments: REPEAT CESAREAN SECTION (N/A) - MD requests RNFA  Patient Location: PACU  Anesthesia Type:Spinal  Level of Consciousness: awake, alert  and oriented  Airway & Oxygen Therapy: Patient Spontanous Breathing  Post-op Assessment: Report given to RN and Post -op Vital signs reviewed and stable  Post vital signs: Reviewed and stable  Last Vitals:  Vitals:   04/10/17 1421 04/10/17 1422  BP:  (!) 156/68  Pulse:  87  Resp: 18   Temp: 36.8 C     Last Pain:  Vitals:   04/10/17 1421  TempSrc: Oral         Complications: No apparent anesthesia complications

## 2017-04-11 LAB — CBC
HEMATOCRIT: 31.5 % — AB (ref 36.0–46.0)
Hemoglobin: 10.8 g/dL — ABNORMAL LOW (ref 12.0–15.0)
MCH: 30.6 pg (ref 26.0–34.0)
MCHC: 34.3 g/dL (ref 30.0–36.0)
MCV: 89.2 fL (ref 78.0–100.0)
PLATELETS: 254 10*3/uL (ref 150–400)
RBC: 3.53 MIL/uL — ABNORMAL LOW (ref 3.87–5.11)
RDW: 14.9 % (ref 11.5–15.5)
WBC: 18.3 10*3/uL — AB (ref 4.0–10.5)

## 2017-04-11 LAB — BIRTH TISSUE RECOVERY COLLECTION (PLACENTA DONATION)

## 2017-04-11 LAB — RPR: RPR Ser Ql: NONREACTIVE

## 2017-04-11 NOTE — Clinical Social Work Maternal (Signed)
  CLINICAL SOCIAL WORK MATERNAL/CHILD NOTE  Patient Details  Name: SHAWNTELLE UNGAR MRN: [id number] Date of Birth: 1981-03-19  Date:  04/11/2017  Clinical Social Worker Initiating Note:  Laurey Arrow Date/ Time Initiated:  04/11/17/1337     Child's Name:  Gwen Pounds   Legal Guardian:  Mother (FOB is Karma Ganja 05/15/1978)   Need for Interpreter:  None   Date of Referral:  04/11/17     Reason for Referral:  Behavioral Health Issues, including SI    Referral Source:  [facility]   Address:  [address] [place] [address]  Phone number:  [phone]   Household Members:  Self, Minor Children, Siblings (MOB resides MOB's parents and minor daughter Caroline More 01/23/2012)   Natural Supports (not living in the home):  Spouse/significant other, Immediate Family, Friends   Professional Supports: Transport planner (MOB's receives [place] interventions at [facility])   Employment: Unemployed   Type of Work:     Education:  Database administrator Resources:  [employer]   Other Resources:  [employer], Physicist, medical    Cultural/Religious Considerations Which May Impact Care:  Per [employer] Sheet, MOB is Catholic  Strengths:  Ability to meet basic needs , Understanding of illness, Home prepared for child    Risk Factors/Current Problems:  Mental Health Concerns    Cognitive State:  Able to Concentrate , Alert , Insightful , Goal Oriented , Linear Thinking    Mood/Affect:  Bright , Calm , Happy , Interested , Comfortable , Relaxed    CSW Assessment: CSW met with MOB to complete an assessment for MH hx. When CSW arrived, MOB was in bed resting and infant was asleep in his bassinet.  MOB was polite, inviting, and easy to engage. CSW inquired about MOB's MH hx and MOB acknowledged an extensive MH hx. MOB reported MOB was dx with bipolar disorder in 2014, and received MH interventions at [place]. MOB also admits to a diagnosis of ADHD and is  currently taking a low dosage of Vyvanse. MOB stated that MOB discontinued the use of MOB's bipolar medication after MOB's pregnancy confirmation. MOB reports symptoms of highs and lows, but was able to manage them with the support of FOB and MOB's immediate family members. MOB communicated the MOB plans to make an appointment for medication management after MOB d/c from hospital. Victoria educated MOB about PPD.  CSW also encouraged MOB to seek medical attention if needed for increased signs and symptoms for PPD. CSW offered MOB resources for outpatient counseling, and MOB declined. MOB expressed that MOB feels comfortable asking for help and will reach out to MOB's OBGYN if help if warranted. CSW provided MOB with a PMD checklist and encouraged MOB to review it weekly; MOB agreed. CSW reviewed SIDS and MOB was knowledgeable. MOB denied SI, HI, and hx of DV.  MOB admits to SA hx and reported that her SA hx has been over 10 years. CSW provided MOB with CSW contact information and encouraged MOB to reach out to CSW if MOB had any questions or concerns.    CSW Plan/Description:  Information/Referral to [employer] , [employer] , No Further Intervention Required/No Barriers to Discharge   Laurey Arrow, MSW, LCSW Clinical Social Work [phone]  Dimple Nanas, LCSW 04/11/2017, 1:41 PM

## 2017-04-11 NOTE — Progress Notes (Signed)
Subjective: Postpartum Day #1: Cesarean Delivery Patient reports incisional pain, tolerating PO and no problems voiding.    Objective: Vital signs in last 24 hours: Temp:  [97.8 F (36.6 C)-99 F (37.2 C)] 99 F (37.2 C) (05/26 0400) Pulse Rate:  [62-90] 62 (05/26 0400) Resp:  [14-19] 18 (05/26 0400) BP: (110-156)/(64-93) 134/81 (05/26 0400) SpO2:  [94 %-99 %] 97 % (05/26 0400) Weight:  [99.8 kg (220 lb)] 99.8 kg (220 lb) (05/25 1004)  Physical Exam:  General: alert Lochia: appropriate Uterine Fundus: firm Incision: dressing C/D/I  Recent Labs  04/10/17 0938 04/11/17 0527  HGB 12.5 10.8*  HCT 37.3 31.5*    Assessment/Plan: Status post Cesarean section. Doing well postoperatively. 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

## 2017-04-11 NOTE — Anesthesia Postprocedure Evaluation (Addendum)
Anesthesia Post Note  Patient: Wendy BignessBeth M Burgess  Procedure(s) Performed: Procedure(s) (LRB): REPEAT CESAREAN SECTION (N/A)  Patient location during evaluation: Mother Baby Anesthesia Type: Spinal Level of consciousness: awake and alert and oriented Pain management: satisfactory to patient Vital Signs Assessment: post-procedure vital signs reviewed and stable Respiratory status: spontaneous breathing and nonlabored ventilation Cardiovascular status: stable Postop Assessment: no headache, no backache, patient able to bend at knees, no signs of nausea or vomiting and adequate PO intake Anesthetic complications: no        Last Vitals:  Vitals:   04/11/17 0200 04/11/17 0400  BP:  134/81  Pulse: 81 62  Resp: 16 18  Temp: 37 C 37.2 C    Last Pain:  Vitals:   04/11/17 0619  TempSrc:   PainSc: 3    Pain Goal: Patients Stated Pain Goal: 2 (04/11/17 16100619)               Madison HickmanGREGORY,SUZANNE

## 2017-04-11 NOTE — Addendum Note (Signed)
Addendum  created 04/11/17 0756 by Shanon PayorGregory, Trenia Tennyson M, CRNA   Sign clinical note

## 2017-04-12 MED ORDER — IBUPROFEN 600 MG PO TABS: 600.0000 mg | ORAL_TABLET | Freq: Four times a day (QID) | ORAL | 0 refills | Status: AC

## 2017-04-12 NOTE — Discharge Summary (Signed)
OB Discharge Summary     Patient Name: [name] DOB: Apr 30, 1981 MRN: [id number]  Date of admission: 04/10/2017 Delivering MD: Jackelyn Knife, Jalon Blackwelder   Date of discharge: 04/12/2017  Admitting diagnosis: repeat c-section Intrauterine pregnancy: [redacted]w[redacted]d     Secondary diagnosis:  Active Problems:   Hypertension affecting pregnancy in third trimester      Discharge diagnosis: Term Pregnancy Delivered, CHTN and AMA, previous c-section                                   Hospital course:  Sceduled C/S   36 y.o. yo G2P2002 at [redacted]w[redacted]d was admitted to the hospital 04/10/2017 for scheduled cesarean section with the following indication:Elective Repeat and CHTN.  Membrane Rupture Time/Date: 3:16 PM ,04/10/2017   Patient delivered a Viable infant.04/10/2017  Details of operation can be found in separate operative note.  Pateint had an uncomplicated postpartum course.  She is ambulating, tolerating a regular diet, passing flatus, and urinating well. Patient is discharged home in stable condition on  04/12/17         Physical exam  Vitals:   04/11/17 1508 04/11/17 1820 04/11/17 2334 04/12/17 0656  BP: 128/79 (!) 149/75 (!) 142/72 134/73  Pulse: 82 69  73  Resp: 18 20  18   Temp: 98.1 F (36.7 C) 98 F (36.7 C)  97.5 F (36.4 C)  TempSrc: Tympanic Oral  Oral  SpO2: 99% 100%    Weight:      Height:       General: alert Lochia: appropriate Uterine Fundus: firm Incision: Healing well with no significant drainage  Labs: Lab Results  Component Value Date   WBC 18.3 (H) 04/11/2017   HGB 10.8 (L) 04/11/2017   HCT 31.5 (L) 04/11/2017   MCV 89.2 04/11/2017   PLT 254 04/11/2017   CMP Latest Ref Rng & Units 04/10/2017  Glucose 65 - 99 mg/dL 82  BUN 6 - 20 mg/dL 9  Creatinine 4.09 - 8.11 mg/dL 9.14  Sodium 782 - 956 mmol/L 137  Potassium 3.5 - 5.1 mmol/L 4.2  Chloride 101 - 111 mmol/L 110  CO2 22 - 32 mmol/L 21(L)  Calcium 8.9 - 10.3 mg/dL 9.1  Total Protein 6.5 - 8.1 g/dL 6.3(L)  Total  Bilirubin 0.3 - 1.2 mg/dL 0.3  Alkaline Phos 38 - 126 U/L 175(H)  AST 15 - 41 U/L 22  ALT 14 - 54 U/L 21    Discharge instruction: per After Visit Summary and "Baby and Me Booklet".  After visit meds:  Allergies as of 04/12/2017      Reactions   Celexa [citalopram Hydrobromide] Other (See Comments)   Patient preference Patient just didn't like the way it made her feel    Wellbutrin [bupropion] Other (See Comments)   Patient preference didn't like the way it made her feel       Medication List    STOP taking these medications   NEXIUM 20 MG capsule Generic drug:  esomeprazole     TAKE these medications   ibuprofen 600 MG tablet Commonly known as:  ADVIL,MOTRIN Take 1 tablet (600 mg total) by mouth every 6 (six) hours.   PROAIR HFA 108 (90 Base) MCG/ACT inhaler Generic drug:  albuterol INHALE 2  PUFFS INTO THE LUNGS EVERY 4-6 HOURS AS NEEDED FOR SHORTNESS OF BREATH   THRIVITE 19 29-1 MG Tabs Take 1 tablet by mouth daily.   VYVANSE  40 MG capsule Generic drug:  lisdexamfetamine TAKE 1 TABLET BY MOUTH EVERY MORNING       Diet: routine diet  Activity: Advance as tolerated. Pelvic rest for 6 weeks.   Outpatient follow up:one week Follow up Appt:Future Appointments Date Time Provider Department Center  04/17/2017 9:00 AM [facility] PAT 5 [facility] None    Newborn Data: Live born female  Birth Weight: 8 lb 0.6 oz (3645 g) APGAR: 9, 10  Baby Feeding: Bottle Disposition:home with mother   04/12/2017 Zenaida Nieceodd D Torrence Hammack, MD

## 2017-04-12 NOTE — Discharge Instructions (Signed)
As per discharge pamphlet °

## 2017-04-12 NOTE — Progress Notes (Signed)
Patient ID: Wendy BignessBeth M Burgess, female   DOB: 09-Jun-1981, 36 y.o.   MRN: [id number] POD #2 Doing ok, sore-especially when coughs Afeb, VSS, BP normal so far Abd- soft, fundus firm, incision intact Continue routine care, ambulate, monitor BP

## 2017-04-17 ENCOUNTER — Encounter (HOSPITAL_COMMUNITY): Admission: RE | Admit: 2017-04-17 | Payer: Medicaid Other | Source: Ambulatory Visit

## 2017-04-24 NOTE — Addendum Note (Signed)
Addendum  created 04/24/17 1252 by Caela Huot, MD   Sign clinical note    

## 2017-05-01 NOTE — Progress Notes (Signed)
Post discharge chart review completed.  

## 2022-10-04 ENCOUNTER — Other Ambulatory Visit: Payer: Self-pay

## 2022-10-04 ENCOUNTER — Encounter (HOSPITAL_COMMUNITY): Payer: Self-pay | Admitting: Registered Nurse

## 2022-10-04 ENCOUNTER — Encounter (HOSPITAL_BASED_OUTPATIENT_CLINIC_OR_DEPARTMENT_OTHER): Payer: Self-pay | Admitting: Emergency Medicine

## 2022-10-04 ENCOUNTER — Emergency Department (HOSPITAL_BASED_OUTPATIENT_CLINIC_OR_DEPARTMENT_OTHER)
Admission: EM | Admit: 2022-10-04 | Discharge: 2022-10-05 | Disposition: A | Discharge status: Home / Self Care | Payer: Medicaid Other | Attending: Emergency Medicine | Admitting: Emergency Medicine

## 2022-10-04 ENCOUNTER — Emergency Department (HOSPITAL_BASED_OUTPATIENT_CLINIC_OR_DEPARTMENT_OTHER): Payer: Medicaid Other

## 2022-10-04 DIAGNOSIS — L02511 Cutaneous abscess of right hand: Secondary | ICD-10-CM

## 2022-10-04 DIAGNOSIS — I10 Essential (primary) hypertension: Secondary | ICD-10-CM | POA: Insufficient documentation

## 2022-10-04 LAB — BASIC METABOLIC PANEL
Anion gap: 9 (ref 5–15)
BUN: 10 mg/dL (ref 6–20)
CO2: 24 mmol/L (ref 22–32)
Calcium: 9.3 mg/dL (ref 8.9–10.3)
Chloride: 104 mmol/L (ref 98–111)
Creatinine, Ser: 0.74 mg/dL (ref 0.44–1.00)
Glucose, Bld: 120 mg/dL — ABNORMAL HIGH (ref 70–99)
Potassium: 3.5 mmol/L (ref 3.5–5.1)
Sodium: 137 mmol/L (ref 135–145)

## 2022-10-04 LAB — CBC
HCT: 44 % (ref 36.0–46.0)
Hemoglobin: 14.9 g/dL (ref 12.0–15.0)
MCH: 30.3 pg (ref 26.0–34.0)
MCHC: 33.9 g/dL (ref 30.0–36.0)
MCV: 89.6 fL (ref 80.0–100.0)
Platelets: 296 10*3/uL (ref 150–400)
RBC: 4.91 MIL/uL (ref 3.87–5.11)
RDW: 12.8 % (ref 11.5–15.5)
WBC: 13.6 10*3/uL — ABNORMAL HIGH (ref 4.0–10.5)
nRBC: 0 % (ref 0.0–0.2)

## 2022-10-04 MED ORDER — LIDOCAINE HCL (PF) 1 % IJ SOLN
5.0000 mL | Freq: Once | INTRAMUSCULAR | Status: DC
  Filled 2022-10-04: qty 5

## 2022-10-04 NOTE — ED Triage Notes (Signed)
Right hand pain, right hand is swollen, right thumb is swollen,red.tender, .

## 2022-10-04 NOTE — ED Notes (Signed)
Patient ambulatory to room.  

## 2022-10-04 NOTE — ED Notes (Signed)
Tried to room patient at 1830 with no answer

## 2022-10-04 NOTE — Anesthesia Preprocedure Evaluation (Signed)
Anesthesia Evaluation    Airway        Dental   Pulmonary neg pulmonary ROS, Current Smoker          Cardiovascular hypertension,      Neuro/Psych   Anxiety     negative neurological ROS     GI/Hepatic negative GI ROS, Neg liver ROS,,,  Endo/Other    Renal/GU   negative genitourinary   Musculoskeletal Right thumb infection    Abdominal   Peds  (+) ATTENTION DEFICIT DISORDER WITHOUT HYPERACTIVITY Hematology   Anesthesia Other Findings   Reproductive/Obstetrics                             Anesthesia Physical Anesthesia Plan  ASA: 2  Anesthesia Plan: General   Post-op Pain Management:    Induction: Intravenous  PONV Risk Score and Plan: 2 and Ondansetron, Dexamethasone, Midazolam and Treatment may vary due to age or medical condition  Airway Management Planned: Mask and LMA  Additional Equipment: None  Intra-op Plan:   Post-operative Plan: Extubation in OR  Informed Consent:   Plan Discussed with:   Anesthesia Plan Comments:        Anesthesia Quick Evaluation

## 2022-10-04 NOTE — Discharge Instructions (Addendum)
Go to [facility] emergency room.  Dr. Merlyn Lot is preparing an OR for you.

## 2022-10-04 NOTE — ED Provider Notes (Signed)
[facility] EMERGENCY DEPT Provider Note   CSN: [id number] Arrival date & time: 10/04/22  1647     History  Chief Complaint  Patient presents with   Hand Pain    [name] is a 41 y.o. female.   Hand Pain  Patient presents with hand pain.  Swollen for the last few days.  States there was a cut on the side of her thumb that she glued.  Then more redness and swelling.  Now redness swelling over the hand.  No fevers.  States she feels fine besides the thumb.  No drainage.    Past Medical History:  Diagnosis Date   ADD (attention deficit disorder)    Anxiety    Drug abuse in remission [facility])    Hypertension    S/P cesarean section 01/25/2012    Home Medications Prior to Admission medications   Medication Sig Start Date End Date Taking? Authorizing Provider  albuterol (PROAIR HFA) 108 (90 Base) MCG/ACT inhaler INHALE 2  PUFFS INTO THE LUNGS EVERY 4-6 HOURS AS NEEDED FOR SHORTNESS OF BREATH    [provider]  ibuprofen (ADVIL,MOTRIN) 600 MG tablet Take 1 tablet (600 mg total) by mouth every 6 (six) hours. 04/12/17   Meisinger, Tawanna Cooler, MD  lisdexamfetamine (VYVANSE) 40 MG capsule TAKE 1 TABLET BY MOUTH EVERY MORNING    [provider]  Prenatal Vit-DSS-Fe Fum-FA (THRIVITE 19) 29-1 MG TABS Take 1 tablet by mouth daily.    [provider]      Allergies    Celexa [citalopram hydrobromide] and Wellbutrin [bupropion]    Review of Systems   Review of Systems  Physical Exam Updated Vital Signs BP 137/74   Pulse 93   Temp 97.8 F (36.6 C) (Oral)   Resp 18   SpO2 98%  Physical Exam Vitals and nursing note reviewed.  Cardiovascular:     Rate and Rhythm: Regular rhythm.  Skin:    Capillary Refill: Capillary refill takes less than 2 seconds.     Comments: Swelling over distal thumb and up to the IP joint.  Some erythema and also some ecchymosis.  Does have erythema that tracks along the dorsum of the right hand.  Some swelling of  the thumb.  Less movement at the IP joint but otherwise free movement in the hand.  Neurological:     Mental Status: She is alert and oriented to person, place, and time.     ED Results / Procedures / Treatments   Labs (all labs ordered are listed, but only abnormal results are displayed) Labs Reviewed  CBC - Abnormal; Notable for the following components:      Result Value   WBC 13.6 (*)    All other components within normal limits  BASIC METABOLIC PANEL - Abnormal; Notable for the following components:   Glucose, Bld 120 (*)    All other components within normal limits    EKG None  Radiology No results found.  Procedures .Marland KitchenIncision and Drainage  Date/Time: 10/04/2022 8:20 PM  Performed by: Benjiman Core, MD Authorized by: Benjiman Core, MD   Consent:    Consent obtained:  Verbal   Consent given by:  Patient   Risks discussed:  Bleeding, incomplete drainage, pain, infection and damage to other organs   Alternatives discussed:  No treatment and delayed treatment Universal protocol:    Patient identity confirmed:  Verbally with patient Location:    Type:  Abscess   Size:  2   Location:  Upper extremity   Upper extremity location:  Finger   Finger location:  R thumb Pre-procedure details:    Skin preparation:  Chlorhexidine Sedation:    Sedation type:  None Anesthesia:    Anesthesia method:  Nerve block   Block location:  Right thumb   Block needle gauge:  27 G   Block anesthetic:  Lidocaine 1% w/o epi   Block technique:  Digital block   Block injection procedure:  Anatomic landmarks identified, anatomic landmarks palpated, introduced needle, negative aspiration for blood and incremental injection   Block outcome:  Incomplete block Procedure type:    Complexity:  Simple Procedure details:    Ultrasound guidance: no     Incision types:  Single straight   Incision depth:  Subcutaneous   Wound management:  Irrigated with saline   Drainage amount:   Scant   Wound treatment:  Wound left open   Packing materials:  None Post-procedure details:    Procedure completion:  Tolerated well, no immediate complications     Medications Ordered in ED Medications  lidocaine (PF) (XYLOCAINE) 1 % injection 5 mL (has no administration in time range)    ED Course/ Medical Decision Making/ A&P                           Medical Decision Making Amount and/or Complexity of Data Reviewed Labs: ordered. Radiology: ordered.  Risk Prescription drug management.   Patient few day history of right thumb pain and swelling.  Worsening swelling.  Differential diagnosis include abscess/cellulitis/osteomyelitis.  White count mildly elevated.  X-ray done reassuring and shows no osteomyelitis.  Incision and drainage done and unfortunately did not have much purulence.  Discussed with Dr. Merlyn Lot from hand surgery.  He reviewed the picture.  Thinks patient be a good candidate for OR drainage.  He will set up the OR.  We will need transfer to [facility].  Patient will go POV.  Is n.p.o. here since 3:00 and repeatedly instructed not to eat any food on the way.  Will discharge.        Final Clinical Impression(s) / ED Diagnoses Final diagnoses:  None    Rx / DC Orders ED Discharge Orders     None         Benjiman Core, MD 10/04/22 2105

## 2022-10-05 ENCOUNTER — Encounter (HOSPITAL_COMMUNITY)
Admission: EM | Disposition: A | Discharge status: Home / Self Care | Payer: Self-pay | Source: Home / Self Care | Attending: Emergency Medicine

## 2022-10-05 HISTORY — PX: INCISION AND DRAINAGE OF WOUND: SHX1803

## 2022-10-05 SURGERY — IRRIGATION AND DEBRIDEMENT WOUND
Anesthesia: General | Laterality: Right

## 2022-10-05 MED ORDER — FENTANYL CITRATE (PF) 250 MCG/5ML IJ SOLN
INTRAMUSCULAR | Status: AC
  Filled 2022-10-05: qty 5

## 2022-10-05 MED ORDER — MIDAZOLAM HCL 2 MG/2ML IJ SOLN
INTRAMUSCULAR | Status: AC
  Filled 2022-10-05: qty 2

## 2022-10-05 NOTE — ED Notes (Signed)
Unable to locate pt in waiting room, attempted to call pt at the number listed in the chart. No answer from pt.

## 2022-10-06 ENCOUNTER — Encounter (HOSPITAL_COMMUNITY): Payer: Self-pay | Admitting: Orthopedic Surgery

## 2023-10-28 ENCOUNTER — Encounter (HOSPITAL_COMMUNITY): Payer: Self-pay

## 2023-10-28 ENCOUNTER — Other Ambulatory Visit: Payer: Self-pay

## 2023-10-28 ENCOUNTER — Emergency Department (HOSPITAL_COMMUNITY)
Admission: EM | Admit: 2023-10-28 | Discharge: 2023-10-28 | Payer: Medicaid Other | Attending: Emergency Medicine | Admitting: Emergency Medicine

## 2023-10-28 ENCOUNTER — Emergency Department (HOSPITAL_COMMUNITY): Payer: Medicaid Other

## 2023-10-28 DIAGNOSIS — H05232 Hemorrhage of left orbit: Secondary | ICD-10-CM | POA: Insufficient documentation

## 2023-10-28 DIAGNOSIS — R519 Headache, unspecified: Secondary | ICD-10-CM | POA: Diagnosis present

## 2023-10-28 DIAGNOSIS — S0012XA Contusion of left eyelid and periocular area, initial encounter: Secondary | ICD-10-CM | POA: Diagnosis not present
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
## 2023-11-21 ENCOUNTER — Encounter (HOSPITAL_COMMUNITY): Payer: Self-pay

## 2023-11-21 ENCOUNTER — Emergency Department (HOSPITAL_COMMUNITY)
Admission: EM | Admit: 2023-11-21 | Discharge: 2023-11-22 | Disposition: A | Discharge status: Home / Self Care | Payer: Medicaid Other | Attending: Emergency Medicine | Admitting: Emergency Medicine

## 2023-11-21 ENCOUNTER — Other Ambulatory Visit: Payer: Self-pay

## 2023-11-21 DIAGNOSIS — Z7982 Long term (current) use of aspirin: Secondary | ICD-10-CM | POA: Diagnosis not present

## 2023-11-21 DIAGNOSIS — R600 Localized edema: Secondary | ICD-10-CM | POA: Insufficient documentation

## 2023-11-21 DIAGNOSIS — L03115 Cellulitis of right lower limb: Secondary | ICD-10-CM | POA: Diagnosis not present

## 2023-11-21 DIAGNOSIS — M7989 Other specified soft tissue disorders: Secondary | ICD-10-CM | POA: Diagnosis present

## 2023-11-21 DIAGNOSIS — I10 Essential (primary) hypertension: Secondary | ICD-10-CM | POA: Diagnosis not present

## 2023-11-21 NOTE — ED Triage Notes (Signed)
 Right lower leg swelling with redness, warm to touch, and pitting edema. Pt also reports right knee pain for 2 weeks after she injured her knee. Pt also c/o that her left leg is swollen.

## 2023-11-22 LAB — BASIC METABOLIC PANEL
Anion gap: 9 (ref 5–15)
BUN: 12 mg/dL (ref 6–20)
CO2: 23 mmol/L (ref 22–32)
Calcium: 8.8 mg/dL — ABNORMAL LOW (ref 8.9–10.3)
Chloride: 103 mmol/L (ref 98–111)
Creatinine, Ser: 0.54 mg/dL (ref 0.44–1.00)
GFR, Estimated: >60 mL/min (ref >60)
Glucose, Bld: 103 mg/dL — ABNORMAL HIGH (ref 70–99)
Potassium: 3.5 mmol/L (ref 3.5–5.1)
Sodium: 135 mmol/L (ref 135–145)

## 2023-11-22 LAB — CBC
HCT: 40.2 % (ref 36.0–46.0)
Hemoglobin: 13.5 g/dL (ref 12.0–15.0)
MCH: 30.5 pg (ref 26.0–34.0)
MCHC: 33.6 g/dL (ref 30.0–36.0)
MCV: 91 fL (ref 80.0–100.0)
Platelets: 321 10*3/uL (ref 150–400)
RBC: 4.42 MIL/uL (ref 3.87–5.11)
RDW: 12.1 % (ref 11.5–15.5)
WBC: 9.6 10*3/uL (ref 4.0–10.5)
nRBC: 0 % (ref 0.0–0.2)

## 2023-11-22 LAB — BRAIN NATRIURETIC PEPTIDE: B Natriuretic Peptide: 39.6 pg/mL (ref 0.0–100.0)

## 2023-11-22 LAB — HCG, SERUM, QUALITATIVE: Preg, Serum: NEGATIVE

## 2023-11-22 MED ORDER — CEFUROXIME AXETIL 500 MG PO TABS: 500.0000 mg | ORAL_TABLET | Freq: Two times a day (BID) | ORAL | 0 refills | Status: AC

## 2023-11-22 MED ORDER — CEFDINIR 300 MG PO CAPS
300.0000 mg | ORAL_CAPSULE | Freq: Two times a day (BID) | ORAL | Status: DC
  Administered 2023-11-22: 300 mg via ORAL
  Filled 2023-11-22 (×2): qty 1

## 2023-11-22 NOTE — ED Provider Notes (Signed)
 [place] EMERGENCY DEPARTMENT AT [facility] Provider Note   CSN: [id number] Arrival date & time: 11/21/23  2152     History  Chief Complaint  Patient presents with   Leg Swelling    [name] is a 43 y.o. female who presents bilateral lower extremity swelling, right significantly greater than left with associated significant redness to the right leg.  No recent travel or prolonged immobilization, surgeries, hormone replacement or history of blood clots.  Patient did have a fall couple weeks on the right knee with some persistent discomfort since that time but does not feel current symptoms are related.  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  History of hypertension and polysubstance use in remission.  On Suboxone.  HPI     Home Medications Prior to Admission medications   Medication Sig Start Date End Date Taking? Authorizing Provider  aspirin EC 81 MG tablet Take 81 mg by mouth once. Swallow whole.   Yes [provider]  cefUROXime  (CEFTIN ) 500 MG tablet Take 1 tablet (500 mg total) by mouth 2 (two) times daily with a meal for 7 days. 11/22/23 11/29/23 Yes [name], PA-C  SUBOXONE 8-2 MG FILM Place under the tongue 2 (two) times daily. 06/20/22  Yes [provider]  ibuprofen  (ADVIL ,MOTRIN ) 600 MG tablet Take 1 tablet (600 mg total) by mouth every 6 (six) hours. Patient not taking: Reported on 11/22/2023 04/12/17   Horacio Boas, MD      Allergies    Celexa [citalopram hydrobromide] and Wellbutrin [bupropion]    Review of Systems   Review of Systems  Cardiovascular:  Positive for leg swelling.  Skin:  Positive for rash.    Physical Exam Updated Vital Signs BP (!) 150/76   Pulse 83   Temp 97.6 F (36.4 C) (Oral)   Resp 18   Ht 5' 8 (1.727 m)   Wt 81.6 kg   LMP 10/21/2023   SpO2 97%   BMI 27.37 kg/m  Physical  Exam Vitals and nursing note reviewed.  Constitutional:      Appearance: She is not ill-appearing or toxic-appearing.  HENT:     Head: Normocephalic and atraumatic.     Mouth/Throat:     Mouth: Mucous membranes are moist.     Pharynx: No oropharyngeal exudate or posterior oropharyngeal erythema.  Eyes:     General:        Right eye: No discharge.        Left eye: No discharge.     Conjunctiva/sclera: Conjunctivae normal.  Cardiovascular:     Rate and Rhythm: Normal rate and regular rhythm.     Pulses: Normal pulses.     Heart sounds: Normal heart sounds. No murmur heard. Pulmonary:     Effort: Pulmonary effort is normal. No respiratory distress.     Breath sounds: Normal breath sounds. No wheezing or rales.  Abdominal:     General: There is no distension.     Palpations: Abdomen is soft.     Tenderness: There is no abdominal tenderness.  Musculoskeletal:        General: No deformity.     Cervical back: Neck supple.     Right lower leg: 1+ Edema present.     Left lower leg: 1+ Edema present.       Legs:  Skin:    General: Skin is warm and dry.  Capillary Refill: Capillary refill takes less than 2 seconds.     Findings: Erythema present.  Neurological:     General: No focal deficit present.     Mental Status: She is alert and oriented to person, place, and time. Mental status is at baseline.  Psychiatric:        Mood and Affect: Mood normal.     ED Results / Procedures / Treatments   Labs (all labs ordered are listed, but only abnormal results are displayed) Labs Reviewed  BASIC METABOLIC PANEL - Abnormal; Notable for the following components:      Result Value   Glucose, Bld 103 (*)    Calcium 8.8 (*)    All other components within normal limits  CBC  HCG, SERUM, QUALITATIVE  BRAIN NATRIURETIC PEPTIDE    EKG None  Radiology No results found.  Procedures Procedures    Medications Ordered in ED Medications  cefdinir  (OMNICEF ) capsule 300 mg (300  mg Oral Given 11/22/23 0331)    ED Course/ Medical Decision Making/ A&P                                 Medical Decision Making 43 y/o female with skin changes to the right lower leg with BLE edema   HTN on intake, VS otherwise normal. Skin exam as above with findings concerning for cellulitis of the lower leg, likely source multiple small cuts from recent shaving. No area of fluctuance to suggest abscess. Clinical concern low for DVT given lack of calf pain or recent risk factors.   Amount and/or Complexity of Data Reviewed Labs: ordered.    Details: Cbc , bmp unremarkable, BNP normal.   Risk Prescription drug management.   Clinical picture most consistent with acute uncomplicated cellulitis of the R lower leg. Abx initiated in the ED, will d/c with course outpatient. Recommend close outpatient follow up with her PCP for reevaluation. Return precautions given. Border of erythema denoted with marking pen by ED RN.   [name] voiced understanding of her medical evaluation and treatment plan. Each of their questions answered to their expressed satisfaction.  Return precautions were given.  Patient is well-appearing, stable, and was discharged in good condition.  This chart was dictated using voice recognition software, Dragon. Despite the best efforts of this provider to proofread and correct errors, errors may still occur which can change documentation meaning.         Final Clinical Impression(s) / ED Diagnoses Final diagnoses:  Cellulitis of right lower extremity    Rx / DC Orders ED Discharge Orders          Ordered    cefUROXime  (CEFTIN ) 500 MG tablet  2 times daily with meals        11/22/23 0305              [name], [name], PA-C 11/22/23 0406    Wendy Raynell Moder, MD 11/22/23 2036

## 2023-11-22 NOTE — Discharge Instructions (Signed)
 You are seen here today for the redness and swelling to right lower leg.  You have a skin infection for which have been started on antibiotics.  Please take the antibiotics for the entire course as prescribed and return to the ER with any new severe symptoms.  Follow-up with your primary care doctor for reevaluation.
